# Patient Record
Sex: Female | Born: 1955 | Race: White | Hispanic: No | Marital: Married | Smoking: Never smoker
Health system: Southern US, Community
[De-identification: ages and names within clinical notes are randomized; demographics above are authoritative.]

## PROBLEM LIST (undated history)

## (undated) DIAGNOSIS — E785 Hyperlipidemia, unspecified: Secondary | ICD-10-CM

## (undated) DIAGNOSIS — Z9889 Other specified postprocedural states: Secondary | ICD-10-CM

## (undated) DIAGNOSIS — T7840XA Allergy, unspecified, initial encounter: Secondary | ICD-10-CM

## (undated) DIAGNOSIS — H269 Unspecified cataract: Secondary | ICD-10-CM

## (undated) DIAGNOSIS — T8859XA Other complications of anesthesia, initial encounter: Secondary | ICD-10-CM

## (undated) DIAGNOSIS — F419 Anxiety disorder, unspecified: Secondary | ICD-10-CM

## (undated) DIAGNOSIS — R112 Nausea with vomiting, unspecified: Secondary | ICD-10-CM

## (undated) DIAGNOSIS — F429 Obsessive-compulsive disorder, unspecified: Secondary | ICD-10-CM

## (undated) HISTORY — PX: OOPHORECTOMY: SHX86

## (undated) HISTORY — PX: COSMETIC SURGERY: SHX468

## (undated) HISTORY — DX: Anxiety disorder, unspecified: F41.9

## (undated) HISTORY — DX: Allergy, unspecified, initial encounter: T78.40XA

## (undated) HISTORY — DX: Obsessive-compulsive disorder, unspecified: F42.9

## (undated) HISTORY — PX: EYE SURGERY: SHX253

## (undated) HISTORY — DX: Unspecified cataract: H26.9

## (undated) HISTORY — DX: Hyperlipidemia, unspecified: E78.5

---

## 1990-03-03 HISTORY — PX: TUBAL LIGATION: SHX77

## 1995-03-04 HISTORY — PX: DOPPLER ECHOCARDIOGRAPHY: SHX263

## 1998-03-03 HISTORY — PX: ABDOMINAL HYSTERECTOMY: SHX81

## 1999-10-31 ENCOUNTER — Other Ambulatory Visit: Admission: RE | Admit: 1999-10-31 | Discharge: 1999-10-31 | Payer: Self-pay | Admitting: Obstetrics and Gynecology

## 2000-10-30 ENCOUNTER — Other Ambulatory Visit: Admission: RE | Admit: 2000-10-30 | Discharge: 2000-10-30 | Payer: Self-pay | Admitting: Obstetrics and Gynecology

## 2002-11-02 ENCOUNTER — Encounter: Payer: Self-pay | Admitting: Family Medicine

## 2004-02-16 ENCOUNTER — Ambulatory Visit: Payer: Self-pay | Admitting: Family Medicine

## 2004-03-03 HISTORY — PX: BREAST SURGERY: SHX581

## 2004-03-03 HISTORY — PX: BREAST BIOPSY: SHX20

## 2004-03-03 LAB — CONVERTED CEMR LAB: Pap Smear: NORMAL

## 2004-12-04 ENCOUNTER — Ambulatory Visit: Payer: Self-pay | Admitting: Family Medicine

## 2004-12-30 ENCOUNTER — Ambulatory Visit: Payer: Self-pay | Admitting: General Surgery

## 2006-01-26 ENCOUNTER — Ambulatory Visit: Payer: Self-pay | Admitting: General Surgery

## 2006-10-09 ENCOUNTER — Encounter: Payer: Self-pay | Admitting: Family Medicine

## 2006-10-09 DIAGNOSIS — F429 Obsessive-compulsive disorder, unspecified: Secondary | ICD-10-CM | POA: Insufficient documentation

## 2006-10-09 DIAGNOSIS — L259 Unspecified contact dermatitis, unspecified cause: Secondary | ICD-10-CM | POA: Insufficient documentation

## 2006-10-09 DIAGNOSIS — J309 Allergic rhinitis, unspecified: Secondary | ICD-10-CM | POA: Insufficient documentation

## 2006-11-06 ENCOUNTER — Ambulatory Visit: Payer: Self-pay | Admitting: Family Medicine

## 2006-12-02 HISTORY — PX: COLONOSCOPY: SHX174

## 2006-12-02 LAB — HM COLONOSCOPY: HM Colonoscopy: NORMAL

## 2006-12-07 ENCOUNTER — Encounter: Payer: Self-pay | Admitting: Family Medicine

## 2006-12-07 ENCOUNTER — Ambulatory Visit: Payer: Self-pay | Admitting: General Surgery

## 2007-02-02 ENCOUNTER — Ambulatory Visit: Payer: Self-pay | Admitting: General Surgery

## 2007-02-11 ENCOUNTER — Encounter: Payer: Self-pay | Admitting: Family Medicine

## 2007-09-02 ENCOUNTER — Encounter: Payer: Self-pay | Admitting: Family Medicine

## 2007-10-11 ENCOUNTER — Telehealth (INDEPENDENT_AMBULATORY_CARE_PROVIDER_SITE_OTHER): Payer: Self-pay | Admitting: *Deleted

## 2007-10-13 ENCOUNTER — Ambulatory Visit: Payer: Self-pay | Admitting: Family Medicine

## 2007-10-13 DIAGNOSIS — E7800 Pure hypercholesterolemia, unspecified: Secondary | ICD-10-CM | POA: Insufficient documentation

## 2007-10-14 LAB — CONVERTED CEMR LAB
Cholesterol: 181 mg/dL (ref 0–200)
HDL: 64.2 mg/dL (ref >39.0)
LDL Cholesterol: 107 mg/dL — ABNORMAL HIGH (ref 0–99)
Triglycerides: 48 mg/dL (ref 0–149)
VLDL: 10 mg/dL (ref 0–40)

## 2008-01-31 ENCOUNTER — Ambulatory Visit: Payer: Self-pay | Admitting: Family Medicine

## 2008-02-03 ENCOUNTER — Ambulatory Visit: Payer: Self-pay | Admitting: General Surgery

## 2008-02-22 ENCOUNTER — Encounter: Payer: Self-pay | Admitting: Family Medicine

## 2008-03-03 HISTORY — PX: LIPOSUCTION: SHX10

## 2008-09-11 ENCOUNTER — Encounter: Payer: Self-pay | Admitting: Family Medicine

## 2008-10-30 ENCOUNTER — Ambulatory Visit: Payer: Self-pay | Admitting: Family Medicine

## 2008-10-31 LAB — CONVERTED CEMR LAB
ALT: 23 units/L (ref 0–35)
AST: 25 units/L (ref 0–37)
Albumin: 4.1 g/dL (ref 3.5–5.2)
Alkaline Phosphatase: 60 units/L (ref 39–117)
Basophils Relative: 0.6 % (ref 0.0–3.0)
CO2: 31 meq/L (ref 19–32)
Calcium: 9.2 mg/dL (ref 8.4–10.5)
Chloride: 104 meq/L (ref 96–112)
Eosinophils Absolute: 0.3 10*3/uL (ref 0.0–0.7)
Hemoglobin: 14.3 g/dL (ref 12.0–15.0)
Lymphocytes Relative: 37.9 % (ref 12.0–46.0)
MCHC: 35.2 g/dL (ref 30.0–36.0)
Monocytes Relative: 6.6 % (ref 3.0–12.0)
Neutro Abs: 2.8 10*3/uL (ref 1.4–7.7)
RBC: 4.37 M/uL (ref 3.87–5.11)
Sodium: 139 meq/L (ref 135–145)
Total CHOL/HDL Ratio: 3
Total Protein: 6.8 g/dL (ref 6.0–8.3)
Triglycerides: 37 mg/dL (ref 0.0–149.0)

## 2009-01-01 ENCOUNTER — Telehealth: Payer: Self-pay | Admitting: Family Medicine

## 2009-02-06 ENCOUNTER — Ambulatory Visit: Payer: Self-pay | Admitting: Family Medicine

## 2009-02-06 ENCOUNTER — Encounter: Payer: Self-pay | Admitting: Family Medicine

## 2009-02-12 ENCOUNTER — Encounter (INDEPENDENT_AMBULATORY_CARE_PROVIDER_SITE_OTHER): Payer: Self-pay | Admitting: *Deleted

## 2010-01-09 ENCOUNTER — Telehealth: Payer: Self-pay | Admitting: Family Medicine

## 2010-03-05 ENCOUNTER — Ambulatory Visit: Payer: Self-pay | Admitting: Family Medicine

## 2010-03-05 ENCOUNTER — Encounter: Payer: Self-pay | Admitting: Family Medicine

## 2010-03-05 LAB — HM MAMMOGRAPHY: HM Mammogram: NORMAL

## 2010-03-06 ENCOUNTER — Ambulatory Visit: Payer: Self-pay | Admitting: Family Medicine

## 2010-03-06 ENCOUNTER — Encounter: Payer: Self-pay | Admitting: Family Medicine

## 2010-03-11 ENCOUNTER — Encounter: Payer: Self-pay | Admitting: Family Medicine

## 2010-04-02 NOTE — Progress Notes (Signed)
Summary: needs orders for mammo and dexa  Phone Note Call from Patient Call back at Home Phone [phone]   Caller: Patient Call For: Judith Part MD Summary of Call: Pt is asking for orders for bone density and mammogram to be done at [facility] clinic.  She wants this scheduled for the first week of january.  OK to leave message on answering machine. Initial call taken by: Lowella Petties CMA, AAMA,  January 09, 2010 9:12 AM  Follow-up for Phone Call        will do ref  have her check with her insurance to make sure dexa is paid for before she gets it , however  Follow-up by: Judith Part MD,  January 09, 2010 9:23 AM  Additional Follow-up for Phone Call Additional follow up Details #1::        MmG on 03/05/2010 at 3:00pm. Bone Density on 03/07/2010 at 10:00. Orders faxed and mailed to the patient. Additional Follow-up by: Carlton Adam,  January 10, 2010 4:06 PM  New Problems: SPECIAL SCREENING FOR OSTEOPOROSIS (ICD-V82.81) FAMILY HISTORY OSTEOPOROSIS (ICD-V17.81) OTHER SCREENING MAMMOGRAM (ICD-V76.12)   New Problems: SPECIAL SCREENING FOR OSTEOPOROSIS (ICD-V82.81) FAMILY HISTORY OSTEOPOROSIS (ICD-V17.81) OTHER SCREENING MAMMOGRAM (ICD-V76.12)

## 2010-04-04 NOTE — Letter (Signed)
Summary: Results Follow up Letter  [place] at [facility]  [address] [place], [place] [address]   Phone: [phone]  Fax: [phone]    03/11/2010 MRN: [id number]    [facility] [address] [place], [place]  [address]    Dear Ms. Reasons,  The following are the results of your recent test(s):  Test         Result    Pap Smear:        Normal _____  Not Normal _____ Comments: ______________________________________________________ Cholesterol: LDL(Bad cholesterol):         Your goal is less than:         HDL (Good cholesterol):       Your goal is more than: Comments:  ______________________________________________________ Mammogram:        Normal __X___  Not Normal _____ Comments:Repeat in one year.   ___________________________________________________________________ Hemoccult:        Normal _____  Not normal _______ Comments:    _____________________________________________________________________ Other Tests:    We routinely do not discuss normal results over the telephone.  If you desire a copy of the results, or you have any questions about this information we can discuss them at your next office visit.   Sincerely,    Idamae Schuller [place],MD  MT/ri

## 2010-09-23 ENCOUNTER — Encounter: Payer: Self-pay | Admitting: Family Medicine

## 2010-09-23 ENCOUNTER — Ambulatory Visit (INDEPENDENT_AMBULATORY_CARE_PROVIDER_SITE_OTHER): Payer: BC Managed Care – PPO | Admitting: Family Medicine

## 2010-09-23 VITALS — BP 120/62 | HR 71 | Temp 98.2°F | Wt 143.0 lb

## 2010-09-23 DIAGNOSIS — L989 Disorder of the skin and subcutaneous tissue, unspecified: Secondary | ICD-10-CM

## 2010-09-23 NOTE — Progress Notes (Signed)
  Subjective:    Patient ID: Donna Larson, female    DOB: 01-07-1956, 55 y.o.   MRN: [id number]  HPI 55 yo pt of Dr. Milinda Larson here for nodule on right 1st digit.  Was working in her garden one month ago, receive a cut that healed well. After it heeled, a small raised firm lesion remained. It's a little painful when you press down but no drainage, no erythema, no limited ROM. No fever or other systemic symptoms.  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  Patient Active Problem List  Diagnoses  . HYPERCHOLESTEROLEMIA  . OBSESSIVE-COMPULSIVE DISORDER  . ALLERGIC RHINITIS  . DERMATITIS, CONTACT, NOS   No past medical history on file. No past surgical history on file. History  Substance Use Topics  . Smoking status: Never Smoker   . Smokeless tobacco: Not on file  . Alcohol Use: Not on file   No family history on file. Allergies  Allergen Reactions  . Penicillins     REACTION: reaction not known  . Sulfonamide Derivatives     REACTION: reaction not known   No current outpatient prescriptions on file prior to visit.   The PMH, PSH, Social History, Family History, Medications, and allergies have been reviewed in [facility], and have been updated if relevant.   Review of Systems    See HPI Objective:   Physical Exam    BP 120/62  Pulse 71  Temp(Src) 98.2 F (36.8 C) (Oral)  Wt 143 lb (64.864 kg) Gen:  Alert, pleasant, NAD Right 1st digit- small raised, mildly erythematous, firm lesions on lateral aspect, non indurated, not warm. Normal ROM.    Assessment & Plan:   1. Skin lesion of hand    New.  Reassurance provided.  Unlikely infectious as symptoms have not progressed. Most likely scar tissue or other inflammatory cause, subcutaneous and not intra articular. Advised watchful waiting. Refer to surgery if she would like it removed. The patient indicates understanding of these issues and agrees with the plan. She will call us in a  week or two with an update.

## 2010-12-05 ENCOUNTER — Other Ambulatory Visit: Payer: Self-pay | Admitting: Family Medicine

## 2010-12-05 ENCOUNTER — Telehealth: Payer: Self-pay | Admitting: Family Medicine

## 2010-12-05 DIAGNOSIS — Z0000 Encounter for general adult medical examination without abnormal findings: Secondary | ICD-10-CM | POA: Insufficient documentation

## 2010-12-05 DIAGNOSIS — E7800 Pure hypercholesterolemia, unspecified: Secondary | ICD-10-CM

## 2010-12-05 NOTE — Telephone Encounter (Signed)
Message copied by Judy Pimple on Thu Dec 05, 2010  9:01 AM ------      Message from: Alvina Chou      Created: Wed Dec 04, 2010  2:14 PM      Regarding: Labs for fri 10-5       Patient is scheduled for CPX labs, please order future labs, Thanks , Camelia Eng                        Sorry I forgot to enter pts name on the last request.....duh

## 2010-12-06 ENCOUNTER — Other Ambulatory Visit (INDEPENDENT_AMBULATORY_CARE_PROVIDER_SITE_OTHER): Payer: BC Managed Care – PPO

## 2010-12-06 DIAGNOSIS — E7800 Pure hypercholesterolemia, unspecified: Secondary | ICD-10-CM

## 2010-12-06 LAB — LIPID PANEL
Cholesterol: 197 mg/dL (ref 0–200)
LDL Cholesterol: 98 mg/dL (ref 0–99)
Total CHOL/HDL Ratio: 2
VLDL: 14.2 mg/dL (ref 0.0–40.0)

## 2010-12-06 LAB — CBC WITH DIFFERENTIAL/PLATELET
Eosinophils Absolute: 0.4 10*3/uL (ref 0.0–0.7)
HCT: 41.9 % (ref 36.0–46.0)
Lymphs Abs: 3.1 10*3/uL (ref 0.7–4.0)
MCHC: 33.7 g/dL (ref 30.0–36.0)
MCV: 94.5 fl (ref 78.0–100.0)
Monocytes Absolute: 0.5 10*3/uL (ref 0.1–1.0)
Neutrophils Relative %: 44.5 % (ref 43.0–77.0)
Platelets: 255 10*3/uL (ref 150.0–400.0)

## 2010-12-06 LAB — COMPREHENSIVE METABOLIC PANEL
ALT: 23 U/L (ref 0–35)
AST: 29 U/L (ref 0–37)
Albumin: 4.1 g/dL (ref 3.5–5.2)
Alkaline Phosphatase: 76 U/L (ref 39–117)
Calcium: 9 mg/dL (ref 8.4–10.5)
Chloride: 107 mEq/L (ref 96–112)
Potassium: 4.2 mEq/L (ref 3.5–5.1)
Sodium: 139 mEq/L (ref 135–145)
Total Protein: 6.8 g/dL (ref 6.0–8.3)

## 2010-12-10 ENCOUNTER — Other Ambulatory Visit: Payer: Self-pay

## 2010-12-11 ENCOUNTER — Encounter: Payer: Self-pay | Admitting: Family Medicine

## 2010-12-16 ENCOUNTER — Ambulatory Visit (INDEPENDENT_AMBULATORY_CARE_PROVIDER_SITE_OTHER): Payer: BC Managed Care – PPO | Admitting: Family Medicine

## 2010-12-16 ENCOUNTER — Encounter: Payer: Self-pay | Admitting: Family Medicine

## 2010-12-16 VITALS — BP 100/64 | HR 72 | Temp 98.4°F | Ht 67.0 in | Wt 142.2 lb

## 2010-12-16 DIAGNOSIS — E7800 Pure hypercholesterolemia, unspecified: Secondary | ICD-10-CM

## 2010-12-16 DIAGNOSIS — Z0000 Encounter for general adult medical examination without abnormal findings: Secondary | ICD-10-CM

## 2010-12-16 DIAGNOSIS — Z23 Encounter for immunization: Secondary | ICD-10-CM

## 2010-12-16 NOTE — Patient Instructions (Signed)
Keep up the good work  Call us in January to refer you for your mammogram  Take your calcium and vitamin D  Keep up healthy diet and exercise

## 2010-12-16 NOTE — Assessment & Plan Note (Signed)
Reviewed health habits including diet and exercise and skin cancer prevention Also reviewed health mt list, fam hx and immunizations  Commended pt on good labs and good habits  Nl exam today She will call us in jan for her mam ref  Had dexa in past for fam hx- normal

## 2010-12-16 NOTE — Progress Notes (Signed)
Subjective:    Patient ID: Donna Larson, female    DOB: 01/10/1956, 55 y.o.   MRN: [id number]  HPI Here for wellness exam and to review chronic med problems   Good bp Good wt ith bmi of 22  Lipids great with high HDL and low LDL Lab Results  Component Value Date   CHOL 197 12/06/2010   CHOL 202* 10/30/2008   CHOL 181 10/13/2007   Lab Results  Component Value Date   HDL 84.40 12/06/2010   HDL 40.98 10/30/2008   HDL 11.9 10/13/2007   Lab Results  Component Value Date   LDLCALC 98 12/06/2010   LDLCALC 107* 10/13/2007   LDLCALC 89 11/06/2006   Lab Results  Component Value Date   TRIG 71.0 12/06/2010   TRIG 37.0 10/30/2008   TRIG 48 10/13/2007   Lab Results  Component Value Date   CHOLHDL 2 12/06/2010   CHOLHDL 3 10/30/2008   CHOLHDL 2.8 CALC 10/13/2007   Lab Results  Component Value Date   LDLDIRECT 109.0 10/30/2008   diet- does eat a very healthy diet -- getting rid of red meat  Eats game occasionally   Mam nl 1/12 Self exam- no lumps or changes   Had hyst partial in past - for fibroids  Last visit was dec/ jan -- all was good Last pap-no longer gets them - does get pelvic exam every year   colonosc 08 nl - rec f/u 10 y   Flu- just had one today  Td 04  Is getting good exercise too   Her OCD gets worse with stress- some elderly family with health problems  Overall is doing ok   Does not need to have any meds refilled   Patient Active Problem List  Diagnoses  . HYPERCHOLESTEROLEMIA  . OBSESSIVE-COMPULSIVE DISORDER  . ALLERGIC RHINITIS  . DERMATITIS, CONTACT, NOS  . Routine general medical examination at a health care facility   Past Medical History  Diagnosis Date  . Allergy     seasonal  . Hyperlipidemia   . OCD (obsessive compulsive disorder)    Past Surgical History  Procedure Date  . Abdominal hysterectomy 2000    Partial, fibroids  . Tubal ligation 1992  . Breast surgery   . Doppler echocardiography 1997    Equivocal   History  Substance  Use Topics  . Smoking status: Never Smoker   . Smokeless tobacco: Never Used  . Alcohol Use: Not on file   Family History  Problem Relation Age of Onset  . Arthritis Mother     osteoarthritis  . Depression Mother   . Hypertension Mother   . Osteoporosis Mother   . Hyperlipidemia Father   . Heart disease Father     Bicuspid aortic valve - replaced; sick sinus syndrome  . Heart disease Paternal Grandfather     MI  . Cancer Paternal Grandfather     Renal  . Heart disease Other     Heart valve repair  . Cancer Other     1 had Breast CA ; 2 had colon CA   Allergies  Allergen Reactions  . Penicillins     REACTION: reaction not known  . Sulfonamide Derivatives     REACTION: reaction not known   Current Outpatient Prescriptions on File Prior to Visit  Medication Sig Dispense Refill  . Calcium Carbonate-Vitamin D (CALCIUM-VITAMIN D) 600-200 MG-UNIT CAPS Take 1 capsule by mouth 3 (three) times daily.       Marland Kitchen FLUoxetine (  PROZAC) 20 MG tablet Take 20 mg by mouth daily.        . Nutritional Supplements (ESTROVEN PO) Take 1 tablet by mouth daily.               Review of Systems Review of Systems  Constitutional: Negative for fever, appetite change, fatigue and unexpected weight change.  Eyes: Negative for pain and visual disturbance.  Respiratory: Negative for cough and shortness of breath.   Cardiovascular: Negative for cp or palpitations    Gastrointestinal: Negative for nausea, diarrhea and constipation.  Genitourinary: Negative for urgency and frequency.  Skin: Negative for pallor or rash   Neurological: Negative for weakness, light-headedness, numbness and headaches.  Hematological: Negative for adenopathy. Does not bruise/bleed easily.  Psychiatric/Behavioral: Negative for dysphoric mood. The patient is not nervous/anxious.          Objective:   Physical Exam  Constitutional: She appears well-developed and well-nourished. No distress.  HENT:  Head: Normocephalic  and atraumatic.  Right Ear: External ear normal.  Left Ear: External ear normal.  Nose: Nose normal.  Mouth/Throat: Oropharynx is clear and moist.  Eyes: Conjunctivae and EOM are normal. Pupils are equal, round, and reactive to light. No scleral icterus.  Neck: Normal range of motion. Neck supple. No JVD present. Carotid bruit is not present. No thyromegaly present.  Cardiovascular: Normal rate, regular rhythm, normal heart sounds and intact distal pulses.  Exam reveals no gallop.   Pulmonary/Chest: Effort normal and breath sounds normal. No respiratory distress. She has no wheezes.  Abdominal: Soft. Bowel sounds are normal. She exhibits no distension and no mass. There is no tenderness.  Musculoskeletal: Normal range of motion. She exhibits no edema and no tenderness.  Lymphadenopathy:    She has no cervical adenopathy.  Neurological: She is alert. She has normal reflexes. No cranial nerve deficit. Coordination normal.  Skin: Skin is warm and dry. No rash noted. No erythema. No pallor.  Psychiatric: She has a normal mood and affect.          Assessment & Plan:

## 2010-12-16 NOTE — Assessment & Plan Note (Signed)
Very well controlled by diet  Urged to keep up the good work  Low sat fat diet reviewed

## 2011-02-17 ENCOUNTER — Telehealth: Payer: Self-pay | Admitting: *Deleted

## 2011-02-17 DIAGNOSIS — Z1231 Encounter for screening mammogram for malignant neoplasm of breast: Secondary | ICD-10-CM | POA: Insufficient documentation

## 2011-02-17 NOTE — Telephone Encounter (Signed)
Will do ref  

## 2011-02-17 NOTE — Telephone Encounter (Signed)
Pt request Referral for Mammogram to be done at [facility] for next month.

## 2011-03-10 ENCOUNTER — Telehealth: Payer: Self-pay

## 2011-03-10 NOTE — Telephone Encounter (Signed)
Pt called and left v/m that she already has mammogram scheduled 04/11/11 at 8:45 am. I confirmed with Heather at [facility]. I did not try to reach pt and I spoke with Shirlee Limerick and she has not tried to call pt and suggested I ck with Asher Muir.

## 2011-04-11 ENCOUNTER — Ambulatory Visit: Payer: Self-pay | Admitting: Family Medicine

## 2011-04-14 ENCOUNTER — Encounter: Payer: Self-pay | Admitting: Family Medicine

## 2011-04-21 ENCOUNTER — Encounter: Payer: Self-pay | Admitting: *Deleted

## 2012-03-10 ENCOUNTER — Telehealth: Payer: Self-pay

## 2012-03-10 NOTE — Telephone Encounter (Signed)
Pt left v/m requesting mammogram appt. Left v/m for pt to call back.

## 2012-03-10 NOTE — Telephone Encounter (Signed)
Left message with pt's husband asking pt to return call.

## 2012-03-11 NOTE — Telephone Encounter (Signed)
Pt said over one yr since saw Dr Milinda Antis; pt request our office schedule screening mammo at [place]. Pt Can go 02/24-28/14 prefers mornings but if needed can do afternoons and prefers 04/26/12 if possible. Call [phone] and may leave message.

## 2012-03-11 NOTE — Telephone Encounter (Signed)
Left v/m at [phone] for pt to call back.

## 2012-03-11 NOTE — Telephone Encounter (Signed)
MMG appt made with [facility] for 04/26/12 at 9:20 and patient is aware.

## 2012-04-26 ENCOUNTER — Ambulatory Visit: Payer: Self-pay | Admitting: Family Medicine

## 2012-04-27 ENCOUNTER — Encounter: Payer: Self-pay | Admitting: Family Medicine

## 2012-04-28 ENCOUNTER — Encounter: Payer: Self-pay | Admitting: *Deleted

## 2012-07-09 ENCOUNTER — Ambulatory Visit (INDEPENDENT_AMBULATORY_CARE_PROVIDER_SITE_OTHER): Payer: BC Managed Care – PPO | Admitting: Family Medicine

## 2012-07-09 ENCOUNTER — Telehealth: Payer: Self-pay | Admitting: Family Medicine

## 2012-07-09 ENCOUNTER — Encounter: Payer: Self-pay | Admitting: Family Medicine

## 2012-07-09 VITALS — BP 118/68 | HR 76 | Temp 98.3°F | Wt 137.5 lb

## 2012-07-09 DIAGNOSIS — J029 Acute pharyngitis, unspecified: Secondary | ICD-10-CM

## 2012-07-09 DIAGNOSIS — J020 Streptococcal pharyngitis: Secondary | ICD-10-CM | POA: Insufficient documentation

## 2012-07-09 LAB — POCT RAPID STREP A (OFFICE): Rapid Strep A Screen: NEGATIVE

## 2012-07-09 MED ORDER — AZITHROMYCIN 500 MG PO TABS: 500.0000 mg | ORAL_TABLET | Freq: Every day | ORAL | Status: DC

## 2012-07-09 NOTE — Progress Notes (Signed)
  Subjective:    Patient ID: Donna Larson, female    DOB: [birth year], 57 y.o.   MRN: [id number]  HPI CC: ST, fever  5d h/o sore throat, along with pus.  + headaches. Tmax 100.5.  Yesterday 99.8.  Today normal.   Mild cough. No abd pain, nausea, minimal sneezing and congestion.  No ear or tooth pain.  Recently stopped allergy meds.  No sick contact or smokers at home. No h/o asthma.  Pt is a doctor.  Past Medical History  Diagnosis Date  . Allergy     seasonal  . Hyperlipidemia   . OCD (obsessive compulsive disorder)      Review of Systems Per HPI    Objective:   Physical Exam  Nursing note and vitals reviewed. Constitutional: She appears well-developed and well-nourished. No distress.  HENT:  Head: Normocephalic and atraumatic.  Right Ear: Hearing, tympanic membrane, external ear and ear canal normal.  Left Ear: Hearing, tympanic membrane, external ear and ear canal normal.  Nose: No mucosal edema or rhinorrhea. Right sinus exhibits no maxillary sinus tenderness and no frontal sinus tenderness. Left sinus exhibits no maxillary sinus tenderness and no frontal sinus tenderness.  Mouth/Throat: Uvula is midline and mucous membranes are normal. Oropharyngeal exudate, posterior oropharyngeal edema and posterior oropharyngeal erythema present. No tonsillar abscesses.  Erythematous oropharynx, enlarged tonsils, and exudates bilateral tonsillar crypts  Eyes: Conjunctivae and EOM are normal. Pupils are equal, round, and reactive to light. No scleral icterus.  Neck: Normal range of motion. Neck supple.  Cardiovascular: Normal rate, regular rhythm, normal heart sounds and intact distal pulses.   No murmur heard. Pulmonary/Chest: Effort normal and breath sounds normal. No respiratory distress. She has no wheezes. She has no rales.  Lymphadenopathy:    She has cervical adenopathy (bilateral AC LAD, tender).  Skin: Skin is warm and dry. No rash noted.       Assessment & Plan:

## 2012-07-09 NOTE — Assessment & Plan Note (Signed)
2/4 centor criteria, however markedly abnormal oropharyngeal exam increases suspicion for strep throat. Treat as such with azithromycin (avoids PCN 2/2 fmhx reaction). See pt instructions for supportive care. Discussed red flags to seek urgent care or return to see [place]. Pt agrees with plan.

## 2012-07-09 NOTE — Patient Instructions (Signed)
This does look like strep despite negative test. Treat with azithromycin. Push fluids and plenty of rest. May use ibuprofen for throat inflammation. Salt water gargles. Suck on cold things like popsicles or warm things like herbal teas (whichever soothes the throat better). Return if fever >101.5, worsening pain, or trouble opening/closing mouth, or hoarse voice. Good to see you today, call clinic with questions.

## 2012-07-09 NOTE — Telephone Encounter (Signed)
Patient being seen now

## 2012-07-09 NOTE — Telephone Encounter (Signed)
Patient Information:  Caller Name: Jayli  Phone: [phone]  Patient: Donna Larson, Donna Larson  Gender: Female  DOB: 03-16-1955  Age: 57 Years  PCP: Roxy Manns [facility])  Office Follow Up:  Does the office need to follow up with this patient?: No  Instructions For The Office: N/A  RN Note:  care advice reviewed along with call back paramters. Appt scheduled.  Symptoms  Reason For Call & Symptoms: Patient states she has had a sore throat since Monday. Temperature for two days. No fever today. Pus on tonsils. Pain with swallowing, tired and rundown feeling.  She is eating and drinking . Voice is clear  Reviewed Health History In EMR: Yes  Reviewed Medications In EMR: Yes  Reviewed Allergies In EMR: Yes  Reviewed Surgeries / Procedures: Yes  Date of Onset of Symptoms: 07/05/2012  Treatments Tried: Fluids, water, Tylenol ,Gargling warm salt water.  Treatments Tried Worked: No  Guideline(s) Used:  Sore Throat  Disposition Per Guideline:   See Today in Office  Reason For Disposition Reached:   Pus on tonsils (back of throat) and swollen neck lymph nodes ("glands")  Advice Given:  For Relief of Sore Throat Pain:  Sip warm chicken broth or apple juice.  Suck on hard candy or a throat lozenge (over-the-counter).  Gargle warm salt water 3 times daily (1 teaspoon of salt in 8 oz or 240 ml of warm water).  Avoid cigarette smoke.  Pain Medicines:  For pain relief, you can take either acetaminophen, ibuprofen, or naproxen.  They are over-the-counter (OTC) pain drugs. You can buy them at the drugstore.  Acetaminophen (e.g., Tylenol):  Regular Strength Tylenol: Take 650 mg (two 325 mg pills) by mouth every 4-6 hours as needed. Each Regular Strength Tylenol pill has 325 mg of acetaminophen.  Extra Strength Tylenol: Take 1,000 mg (two 500 mg pills) every 8 hours as needed. Each Extra Strength Tylenol pill has 500 mg of acetaminophen.  Ibuprofen (e.g., Motrin, Advil):  Take 400 mg  (two 200 mg pills) by mouth every 6 hours.  Soft Diet:   Cold drinks and milk shakes are especially good (Reason: swollen tonsils can make some foods hard to swallow).  Liquids:  Adequate liquid intake is important to prevent dehydration. Drink 6-8 glasses of water per day.  Call Back If:  Sore throat is mild but lasts longer than 4 days  Fever lasts longer than 3 days  You become worse.  Patient Will Follow Care Advice:  YES  Appointment Scheduled:  07/09/2012 15:30:00 Appointment Scheduled Provider:  Eustaquio Boyden [facility])

## 2013-01-07 ENCOUNTER — Telehealth: Payer: Self-pay | Admitting: Family Medicine

## 2013-01-07 NOTE — Telephone Encounter (Signed)
Pt requesting refill for Vagifem.  Last physical was in 2012.  Please advise.

## 2013-01-07 NOTE — Telephone Encounter (Signed)
?   Last refill -she must have been getting it from another doctor  Please schedule PE and refill until then

## 2013-01-11 NOTE — Telephone Encounter (Signed)
Left message with family requesting pt to call office  

## 2013-01-12 NOTE — Telephone Encounter (Signed)
Pt already has CPE scheduled but pt said [facility] sent Rx to [place] by mistake then they faxed the request to the correct doctor, pt doesn't need Rx refilled because her GYN had already refilled it for her

## 2013-01-23 ENCOUNTER — Telehealth: Payer: Self-pay | Admitting: Family Medicine

## 2013-01-23 DIAGNOSIS — Z0000 Encounter for general adult medical examination without abnormal findings: Secondary | ICD-10-CM

## 2013-01-23 NOTE — Telephone Encounter (Signed)
Message copied by Judy Pimple on Sun Jan 23, 2013  4:16 PM ------      Message from: Alvina Chou      Created: Tue Jan 18, 2013 12:14 PM      Regarding: Lab orders for Monday, 11.24.14       Patient is scheduled for CPX labs, please order future labs, Thanks , Terri       ------

## 2013-01-24 ENCOUNTER — Other Ambulatory Visit (INDEPENDENT_AMBULATORY_CARE_PROVIDER_SITE_OTHER): Payer: BC Managed Care – PPO

## 2013-01-24 DIAGNOSIS — Z0000 Encounter for general adult medical examination without abnormal findings: Secondary | ICD-10-CM

## 2013-01-24 LAB — CBC WITH DIFFERENTIAL/PLATELET
Basophils Absolute: 0 10*3/uL (ref 0.0–0.1)
Basophils Relative: 0.6 % (ref 0.0–3.0)
Eosinophils Absolute: 0.4 10*3/uL (ref 0.0–0.7)
Eosinophils Relative: 5.6 % — ABNORMAL HIGH (ref 0.0–5.0)
HCT: 38 % (ref 36.0–46.0)
Lymphocytes Relative: 37.9 % (ref 12.0–46.0)
Lymphs Abs: 2.9 10*3/uL (ref 0.7–4.0)
Monocytes Absolute: 0.5 10*3/uL (ref 0.1–1.0)
Neutrophils Relative %: 48.6 % (ref 43.0–77.0)
Platelets: 301 10*3/uL (ref 150.0–400.0)
RBC: 4.13 Mil/uL (ref 3.87–5.11)
RDW: 12.6 % (ref 11.5–14.6)
WBC: 7.5 10*3/uL (ref 4.5–10.5)

## 2013-01-24 LAB — LIPID PANEL
HDL: 81.7 mg/dL (ref >39.00)
LDL Cholesterol: 93 mg/dL (ref 0–99)
Total CHOL/HDL Ratio: 2
Triglycerides: 43 mg/dL (ref 0.0–149.0)

## 2013-01-24 LAB — TSH: TSH: 2.06 u[IU]/mL (ref 0.35–5.50)

## 2013-01-24 LAB — COMPREHENSIVE METABOLIC PANEL
ALT: 16 U/L (ref 0–35)
AST: 23 U/L (ref 0–37)
Albumin: 3.7 g/dL (ref 3.5–5.2)
BUN: 15 mg/dL (ref 6–23)
Calcium: 9.2 mg/dL (ref 8.4–10.5)
Chloride: 103 mEq/L (ref 96–112)
Glucose, Bld: 108 mg/dL — ABNORMAL HIGH (ref 70–99)
Potassium: 4.3 mEq/L (ref 3.5–5.1)
Sodium: 138 mEq/L (ref 135–145)
Total Bilirubin: 0.5 mg/dL (ref 0.3–1.2)
Total Protein: 6.9 g/dL (ref 6.0–8.3)

## 2013-01-31 ENCOUNTER — Ambulatory Visit (INDEPENDENT_AMBULATORY_CARE_PROVIDER_SITE_OTHER): Payer: BC Managed Care – PPO | Admitting: Family Medicine

## 2013-01-31 ENCOUNTER — Encounter: Payer: Self-pay | Admitting: Family Medicine

## 2013-01-31 VITALS — BP 104/66 | HR 66 | Temp 98.4°F | Ht 66.75 in | Wt 136.8 lb

## 2013-01-31 DIAGNOSIS — Z0000 Encounter for general adult medical examination without abnormal findings: Secondary | ICD-10-CM

## 2013-01-31 NOTE — Progress Notes (Signed)
Pre-visit discussion using our clinic review tool. No additional management support is needed unless otherwise documented below in the visit note.  

## 2013-01-31 NOTE — Assessment & Plan Note (Signed)
Reviewed health habits including diet and exercise and skin cancer prevention Reviewed appropriate screening tests for age  Also reviewed health mt list, fam hx and immunization status , as well as social and family history   See HPI Wellness labs reviewed  

## 2013-01-31 NOTE — Patient Instructions (Addendum)
I'm glad you are doing so well  Take good care of yourself  Cholesterol looks good  Don't forget to schedule your own mammogram

## 2013-01-31 NOTE — Progress Notes (Signed)
Subjective:    Patient ID: Donna Larson, female    DOB: 1955/09/18, 57 y.o.   MRN: [id number]  HPI Here for health maintenance exam and to review chronic medical problems   Doing pretty well overall   A very busy time lately Also getting over a cold - cough is hanging and able to cough up some mucous  No fever since the beginning    Wt is down 1 lb with bmi of 21   Had a Tdap a year ago at work   Flu vaccine 9/14   Gyn exam 12/11-- she has appt 12/29 -- later this month  Does exam w/o a pap  Still using the vaginal estradiol  Partial hyst in past for fibroids   Mammogram 2/14 normal  Self exam - no lumps or changes   colonosc 10/08 normal with 10 year recall   Mood - has been pretty good overall / even in the face of situational stress  On prozac - still working for her  Lots to do at work- gets overloaded     Chemistry      Component Value Date/Time   NA 138 01/24/2013 0827   K 4.3 01/24/2013 0827   CL 103 01/24/2013 0827   CO2 29 01/24/2013 0827   BUN 15 01/24/2013 0827   CREATININE 0.8 01/24/2013 0827      Component Value Date/Time   CALCIUM 9.2 01/24/2013 0827   ALKPHOS 64 01/24/2013 0827   AST 23 01/24/2013 0827   ALT 16 01/24/2013 0827   BILITOT 0.5 01/24/2013 0827     glucose 108 - this tends to fluctuate/ she is not overwt  Lab Results  Component Value Date   WBC 7.5 01/24/2013   HGB 12.8 01/24/2013   HCT 38.0 01/24/2013   MCV 92.0 01/24/2013   PLT 301.0 01/24/2013    Lab Results  Component Value Date   TSH 2.06 01/24/2013   Hyperlipidemia  Lab Results  Component Value Date   CHOL 183 01/24/2013   CHOL 197 12/06/2010   CHOL 202* 10/30/2008   Lab Results  Component Value Date   HDL 81.70 01/24/2013   HDL 84.40 12/06/2010   HDL 13.24 10/30/2008   Lab Results  Component Value Date   LDLCALC 93 01/24/2013   LDLCALC 98 12/06/2010   LDLCALC 107* 10/13/2007   Lab Results  Component Value Date   TRIG 43.0 01/24/2013   TRIG 71.0  12/06/2010   TRIG 37.0 10/30/2008   Lab Results  Component Value Date   CHOLHDL 2 01/24/2013   CHOLHDL 2 12/06/2010   CHOLHDL 3 10/30/2008  very good cholesterol level overall  Getting exercise- she walks almost every day    Lab Results  Component Value Date   LDLDIRECT 109.0 10/30/2008     Review of Systems Review of Systems  Constitutional: Negative for fever, appetite change, fatigue and unexpected weight change.  Eyes: Negative for pain and visual disturbance.  ENT pos for rhinorrhea and congestion without sinus pain  Respiratory: Negative for cough and shortness of breath.   Cardiovascular: Negative for cp or palpitations    Gastrointestinal: Negative for nausea, diarrhea and constipation.  Genitourinary: Negative for urgency and frequency.  Skin: Negative for pallor or rash   Neurological: Negative for weakness, light-headedness, numbness and headaches.  Hematological: Negative for adenopathy. Does not bruise/bleed easily.  Psychiatric/Behavioral: Negative for dysphoric mood. The patient is not nervous/anxious.         Objective:  Physical Exam  Constitutional: She appears well-developed and well-nourished. No distress.  Slim and well appearing   HENT:  Head: Normocephalic and atraumatic.  Right Ear: External ear normal.  Left Ear: External ear normal.  Mouth/Throat: Oropharynx is clear and moist.  Nares are injected and congested  Mild clear rhinorrhea  Eyes: Conjunctivae and EOM are normal. Pupils are equal, round, and reactive to light. Right eye exhibits no discharge. Left eye exhibits no discharge. No scleral icterus.  Neck: Normal range of motion. Neck supple. No JVD present. No thyromegaly present.  Cardiovascular: Normal rate, regular rhythm, normal heart sounds and intact distal pulses.  Exam reveals no gallop.   Pulmonary/Chest: Effort normal and breath sounds normal. No respiratory distress. She has no wheezes. She has no rales.  Abdominal: Soft. Bowel  sounds are normal. She exhibits no distension and no mass. There is no tenderness.  Musculoskeletal: She exhibits no edema and no tenderness.  Lymphadenopathy:    She has no cervical adenopathy.  Neurological: She is alert. She has normal reflexes. No cranial nerve deficit. She exhibits normal muscle tone. Coordination normal.  Skin: Skin is warm and dry. No rash noted. No erythema. No pallor.  Psychiatric: She has a normal mood and affect.          Assessment & Plan:

## 2013-03-03 HISTORY — PX: BREAST BIOPSY: SHX20

## 2013-04-27 ENCOUNTER — Encounter: Payer: Self-pay | Admitting: Family Medicine

## 2013-04-27 ENCOUNTER — Ambulatory Visit: Payer: Self-pay | Admitting: Family Medicine

## 2013-05-10 ENCOUNTER — Ambulatory Visit: Payer: Self-pay | Admitting: Family Medicine

## 2013-05-11 ENCOUNTER — Encounter: Payer: Self-pay | Admitting: General Surgery

## 2013-05-11 ENCOUNTER — Encounter: Payer: Self-pay | Admitting: Family Medicine

## 2013-05-11 ENCOUNTER — Ambulatory Visit (INDEPENDENT_AMBULATORY_CARE_PROVIDER_SITE_OTHER): Payer: BC Managed Care – PPO | Admitting: General Surgery

## 2013-05-11 VITALS — BP 116/62 | HR 72 | Resp 12 | Ht 66.0 in | Wt 140.0 lb

## 2013-05-11 DIAGNOSIS — R920 Mammographic microcalcification found on diagnostic imaging of breast: Secondary | ICD-10-CM

## 2013-05-11 NOTE — Progress Notes (Signed)
Patient ID: Donna HighlandSarah J Larson, female   DOB: 21-Jan-1956, 58 y.o.   MRN: [id number]  Chief Complaint  Patient presents with  . Breast Problem    HPI Donna Larson is a 58 y.o. female.  who presents for a breast evaluation. The most recent mammogram was done on 05-10-13.  Patient does perform regular self breast checks and gets regular mammograms done.    HPI  Past Medical History  Diagnosis Date  . Allergy     seasonal  . Hyperlipidemia   . OCD (obsessive compulsive disorder)     Past Surgical History  Procedure Laterality Date  . Abdominal hysterectomy  2000    Partial, fibroids  . Tubal ligation  1992  . Doppler echocardiography  1997    Equivocal  . Breast surgery Left 2006  . Liposuction  2010    Family History  Problem Relation Age of Onset  . Arthritis Mother     osteoarthritis  . Depression Mother   . Hypertension Mother   . Osteoporosis Mother   . Hyperlipidemia Father   . Heart disease Father     Bicuspid aortic valve - replaced; sick sinus syndrome  . Heart disease Paternal Grandfather     MI  . Cancer Paternal Grandfather     Renal  . Heart disease Other     Heart valve repair  . Cancer Other     1 had Breast CA ; 2 had colon CA    Social History History  Substance Use Topics  . Smoking status: Never Smoker   . Smokeless tobacco: Never Used  . Alcohol Use: Yes     Comment: daily-wine/beer     Allergies  Allergen Reactions  . Penicillins     REACTION: reaction not known  . Shellfish Allergy Nausea And Vomiting  . Sulfonamide Derivatives     REACTION: reaction not known    Current Outpatient Prescriptions  Medication Sig Dispense Refill  . Calcium Carbonate-Vitamin D (CALCIUM-VITAMIN D) 600-200 MG-UNIT CAPS Take 1 capsule by mouth 3 (three) times daily.       . Cholecalciferol (VITAMIN D) 400 UNITS capsule Take 1,200 Units by mouth daily.      Marland Kitchen. FLUoxetine (PROZAC) 20 MG tablet Take 20 mg by mouth daily.        Marland Kitchen. ibuprofen (ADVIL,MOTRIN)  600 MG tablet Take 600 mg by mouth every 6 (six) hours as needed.      . Multiple Vitamins-Minerals (PRESERVISION AREDS PO) Take 1 tablet by mouth daily.      . Nutritional Supplements (ESTROVEN PO) Take 1 tablet by mouth daily.         No current facility-administered medications for this visit.    Review of Systems Review of Systems  Constitutional: Negative.   Respiratory: Negative.   Cardiovascular: Negative.     Blood pressure 116/62, pulse 72, resp. rate 12, height 5\' 6"  (1.676 m), weight 140 lb (63.504 kg).  Physical Exam Physical Exam  Constitutional: She is oriented to person, place, and time. She appears well-developed and well-nourished.  Neck: Neck supple.  Cardiovascular: Normal rate, regular rhythm and normal heart sounds.   Pulmonary/Chest: Effort normal and breath sounds normal. Right breast exhibits no inverted nipple, no mass, no nipple discharge, no skin change and no tenderness. Left breast exhibits no inverted nipple, no mass, no nipple discharge, no skin change and no tenderness.  Lymphadenopathy:    She has no cervical adenopathy.    She has no axillary adenopathy.  Neurological: She is alert and oriented to person, place, and time.  Skin: Skin is warm and dry.    Data Reviewed Motor on mammograms dated April 27, 2013 identified calcifications of left breast.  Focal spot compression views of left breast dated May 10, 2013 showed a 1.2 x 2.4 x 3.8 cm area of calcifications in the upper outer quadrant described as linear with some branching. BI-RAD-4.  Assessment    Microcalcifications of the left breast. Solid we'll change over time with better visualization as the adjacent breast parenchyma received.     Plan    Options for management were reviewed: 1) careful 6 month followup exam versus 2) earlier stereotactic biopsy. The patient is not want to watch and wait, and for that reason arrangements are in place for a biopsy next week.     Patient has  been scheduled for a left breast stereotactic biopsy at [facility] for 05-16-13 at 3 pm. She will check-in at the [facility] at 2:30 pm. This patient is aware of date, time, and instructions. Patient verbalizes understanding.    Donna Larson 05/14/2013, 8:53 PM  Roxy Manns, MD

## 2013-05-11 NOTE — Patient Instructions (Addendum)
Continue self breast exams. Call office for any new breast issues or concerns.  Patient has been scheduled for a left breast stereotactic biopsy at [facility] for 05-16-13 at 3 pm. [facility] at 2:30 pm. This patient is aware of date, time, and instructions. Patient verbalizes understanding.

## 2013-05-14 DIAGNOSIS — R920 Mammographic microcalcification found on diagnostic imaging of breast: Secondary | ICD-10-CM | POA: Insufficient documentation

## 2013-05-16 ENCOUNTER — Ambulatory Visit: Payer: Self-pay | Admitting: General Surgery

## 2013-05-16 DIAGNOSIS — R920 Mammographic microcalcification found on diagnostic imaging of breast: Secondary | ICD-10-CM

## 2013-05-18 ENCOUNTER — Telehealth: Payer: Self-pay | Admitting: General Surgery

## 2013-05-18 LAB — PATHOLOGY REPORT

## 2013-05-18 NOTE — Telephone Encounter (Signed)
Notified path stromal sclerosis. No pain.  F/U as scheduled.

## 2013-05-19 ENCOUNTER — Encounter: Payer: Self-pay | Admitting: General Surgery

## 2013-05-22 ENCOUNTER — Encounter: Payer: Self-pay | Admitting: General Surgery

## 2013-05-22 NOTE — Progress Notes (Signed)
Patient ID: Donna HighlandSarah J Propes, female   DOB: 1956-02-15, 58 y.o.   MRN: [id number]   A copy of the 05/16/2013 breast biopsy report was faxed to the patient per her request. [phone]

## 2013-05-23 ENCOUNTER — Ambulatory Visit (INDEPENDENT_AMBULATORY_CARE_PROVIDER_SITE_OTHER): Payer: BC Managed Care – PPO | Admitting: *Deleted

## 2013-05-23 DIAGNOSIS — R920 Mammographic microcalcification found on diagnostic imaging of breast: Secondary | ICD-10-CM

## 2013-05-23 NOTE — Progress Notes (Signed)
Patient here today for follow up post left breast biopsy. steristrip in place and aware it may come off in one week.  Minimal bruising noted.  The patient is aware that a heating pad may be used for comfort as needed.  Aware of pathology. Follow up as scheduled.

## 2013-05-24 ENCOUNTER — Encounter: Payer: Self-pay | Admitting: Family Medicine

## 2013-12-07 ENCOUNTER — Ambulatory Visit (INDEPENDENT_AMBULATORY_CARE_PROVIDER_SITE_OTHER): Payer: BC Managed Care – PPO

## 2013-12-07 DIAGNOSIS — Z23 Encounter for immunization: Secondary | ICD-10-CM

## 2014-01-02 ENCOUNTER — Encounter: Payer: Self-pay | Admitting: General Surgery

## 2014-05-25 ENCOUNTER — Encounter: Payer: Self-pay | Admitting: General Surgery

## 2014-06-19 ENCOUNTER — Encounter: Payer: Self-pay | Admitting: Family Medicine

## 2014-06-19 ENCOUNTER — Telehealth: Payer: Self-pay | Admitting: Family Medicine

## 2014-06-19 DIAGNOSIS — Z0000 Encounter for general adult medical examination without abnormal findings: Secondary | ICD-10-CM

## 2014-06-19 NOTE — Telephone Encounter (Signed)
-----   Message from Terri J Walsh sent at 06/15/2014  3:00 PM EDT ----- Regarding: Lab orders for Tuesday, 4.19.16 Patient is scheduled for CPX labs, please order future labs, Thanks , Terri  

## 2014-06-20 ENCOUNTER — Other Ambulatory Visit (INDEPENDENT_AMBULATORY_CARE_PROVIDER_SITE_OTHER): Payer: BLUE CROSS/BLUE SHIELD

## 2014-06-20 DIAGNOSIS — Z0000 Encounter for general adult medical examination without abnormal findings: Secondary | ICD-10-CM | POA: Diagnosis not present

## 2014-06-20 LAB — CBC WITH DIFFERENTIAL/PLATELET
BASOS PCT: 0.4 % (ref 0.0–3.0)
Basophils Absolute: 0 10*3/uL (ref 0.0–0.1)
Eosinophils Absolute: 0.2 10*3/uL (ref 0.0–0.7)
Eosinophils Relative: 3.7 % (ref 0.0–5.0)
HEMATOCRIT: 41.8 % (ref 36.0–46.0)
HEMOGLOBIN: 14.2 g/dL (ref 12.0–15.0)
LYMPHS PCT: 37.6 % (ref 12.0–46.0)
Lymphs Abs: 2.3 10*3/uL (ref 0.7–4.0)
MCHC: 34 g/dL (ref 30.0–36.0)
MCV: 92.6 fl (ref 78.0–100.0)
MONO ABS: 0.5 10*3/uL (ref 0.1–1.0)
Monocytes Relative: 7.5 % (ref 3.0–12.0)
NEUTROS ABS: 3.1 10*3/uL (ref 1.4–7.7)
Neutrophils Relative %: 50.8 % (ref 43.0–77.0)
Platelets: 295 10*3/uL (ref 150.0–400.0)
RBC: 4.51 Mil/uL (ref 3.87–5.11)
RDW: 12.8 % (ref 11.5–15.5)
WBC: 6.1 10*3/uL (ref 4.0–10.5)

## 2014-06-20 LAB — COMPREHENSIVE METABOLIC PANEL
ALBUMIN: 4.2 g/dL (ref 3.5–5.2)
ALT: 18 U/L (ref 0–35)
AST: 19 U/L (ref 0–37)
Alkaline Phosphatase: 61 U/L (ref 39–117)
BUN: 17 mg/dL (ref 6–23)
CHLORIDE: 104 meq/L (ref 96–112)
CO2: 31 mEq/L (ref 19–32)
Calcium: 9.6 mg/dL (ref 8.4–10.5)
Creatinine, Ser: 0.83 mg/dL (ref 0.40–1.20)
GFR: 74.81 mL/min (ref >60.00)
Glucose, Bld: 105 mg/dL — ABNORMAL HIGH (ref 70–99)
Potassium: 5.2 mEq/L — ABNORMAL HIGH (ref 3.5–5.1)
Sodium: 137 mEq/L (ref 135–145)
Total Bilirubin: 0.5 mg/dL (ref 0.2–1.2)
Total Protein: 6.9 g/dL (ref 6.0–8.3)

## 2014-06-20 LAB — TSH: TSH: 3.16 u[IU]/mL (ref 0.35–4.50)

## 2014-06-20 LAB — LIPID PANEL
Cholesterol: 209 mg/dL — ABNORMAL HIGH (ref 0–200)
HDL: 84.9 mg/dL (ref >39.00)
LDL Cholesterol: 115 mg/dL — ABNORMAL HIGH (ref 0–99)
NonHDL: 124.1
Total CHOL/HDL Ratio: 2
Triglycerides: 45 mg/dL (ref 0.0–149.0)
VLDL: 9 mg/dL (ref 0.0–40.0)

## 2014-06-28 ENCOUNTER — Ambulatory Visit (INDEPENDENT_AMBULATORY_CARE_PROVIDER_SITE_OTHER): Payer: BLUE CROSS/BLUE SHIELD | Admitting: Family Medicine

## 2014-06-28 ENCOUNTER — Telehealth: Payer: Self-pay | Admitting: Family Medicine

## 2014-06-28 ENCOUNTER — Encounter: Payer: Self-pay | Admitting: Family Medicine

## 2014-06-28 VITALS — BP 108/72 | HR 66 | Temp 98.6°F | Ht 67.0 in | Wt 141.5 lb

## 2014-06-28 DIAGNOSIS — Z0000 Encounter for general adult medical examination without abnormal findings: Secondary | ICD-10-CM | POA: Diagnosis not present

## 2014-06-28 DIAGNOSIS — R920 Mammographic microcalcification found on diagnostic imaging of breast: Secondary | ICD-10-CM

## 2014-06-28 DIAGNOSIS — Z1231 Encounter for screening mammogram for malignant neoplasm of breast: Secondary | ICD-10-CM

## 2014-06-28 NOTE — Patient Instructions (Signed)
Stop at check out for referral for mammogram  Take care of yourself  Good job with better diet and exercise

## 2014-06-28 NOTE — Telephone Encounter (Signed)
i called [facility] to set Donna Larson up for her screening mammogram.  [facility] the last time Donna Donna Larson was there she had catagory 4 mammogram. March 2015.  Pt stated she had biopsy.  jamie needs diagnotic order not screening for the mammogram

## 2014-06-28 NOTE — Progress Notes (Signed)
Pre visit review using our clinic review tool, if applicable. No additional management support is needed unless otherwise documented below in the visit note. 

## 2014-06-28 NOTE — Progress Notes (Signed)
Subjective:    Patient ID: Donna Larson, female    DOB: [birth year], 59 y.o.   MRN: [id number]  HPI Here for health maintenance exam and to review chronic medical problems    Has been pretty stressed out this year  Kept getting upper resp infx in the fall and winter  Gained some wt  Stress - work and also caring for mother    Wt is up 1 lb with bmi of 22 Has been eating more hamburgers and bratwurst and overdid it at [employer] and [employer]  Now trying to choose better things  Less time for self care  Better with exercise -walking more /making the effort and has worked to get weight    C/ HIV screening - donates blood so that has been done   Has had a hysterectomy  Mammogram 3/15- showed microcalcifications -and bx was neg for malignancy  Due for her yearly mammogram - goes to [facility] Self exam - no lumps or changes   Colonoscopy 10/08- 10 year recall  No fam hx of colon cancer   Td 12/13   Results for orders placed or performed in visit on 06/20/14  Comprehensive metabolic panel  Result Value Ref Range   Sodium 137 135 - 145 mEq/L   Potassium 5.2 (H) 3.5 - 5.1 mEq/L   Chloride 104 96 - 112 mEq/L   CO2 31 19 - 32 mEq/L   Glucose, Bld 105 (H) 70 - 99 mg/dL   BUN 17 6 - 23 mg/dL   Creatinine, Ser 4.09 0.40 - 1.20 mg/dL   Total Bilirubin 0.5 0.2 - 1.2 mg/dL   Alkaline Phosphatase 61 39 - 117 U/L   AST 19 0 - 37 U/L   ALT 18 0 - 35 U/L   Total Protein 6.9 6.0 - 8.3 g/dL   Albumin 4.2 3.5 - 5.2 g/dL   Calcium 9.6 8.4 - 81.1 mg/dL   GFR 91.47 >82.95 mL/min  Lipid panel  Result Value Ref Range   Cholesterol 209 (H) 0 - 200 mg/dL   Triglycerides 62.1 0.0 - 149.0 mg/dL   HDL 30.86 >57.84 mg/dL   VLDL 9.0 0.0 - 69.6 mg/dL   LDL Cholesterol 295 (H) 0 - 99 mg/dL   Total CHOL/HDL Ratio 2    NonHDL 124.10   TSH  Result Value Ref Range   TSH 3.16 0.35 - 4.50 uIU/mL  CBC with Differential/Platelet  Result Value Ref Range   WBC 6.1 4.0 - 10.5 K/uL   RBC 4.51 3.87 - 5.11  Mil/uL   Hemoglobin 14.2 12.0 - 15.0 g/dL   HCT 28.4 13.2 - 44.0 %   MCV 92.6 78.0 - 100.0 fl   MCHC 34.0 30.0 - 36.0 g/dL   RDW 10.2 72.5 - 36.6 %   Platelets 295.0 150.0 - 400.0 K/uL   Neutrophils Relative % 50.8 43.0 - 77.0 %   Lymphocytes Relative 37.6 12.0 - 46.0 %   Monocytes Relative 7.5 3.0 - 12.0 %   Eosinophils Relative 3.7 0.0 - 5.0 %   Basophils Relative 0.4 0.0 - 3.0 %   Neutro Abs 3.1 1.4 - 7.7 K/uL   Lymphs Abs 2.3 0.7 - 4.0 K/uL   Monocytes Absolute 0.5 0.1 - 1.0 K/uL   Eosinophils Absolute 0.2 0.0 - 0.7 K/uL   Basophils Absolute 0.0 0.0 - 0.1 K/uL   Stable /good labs    Patient Active Problem List   Diagnosis Date Noted  . Breast microcalcification, mammographic 05/14/2013  .  Other screening mammogram 02/17/2011  . Routine general medical examination at a health care facility 12/05/2010  . OBSESSIVE-COMPULSIVE DISORDER 10/09/2006  . ALLERGIC RHINITIS 10/09/2006  . DERMATITIS, CONTACT, NOS 10/09/2006   Past Medical History  Diagnosis Date  . Allergy     seasonal  . Hyperlipidemia   . OCD (obsessive compulsive disorder)    Past Surgical History  Procedure Laterality Date  . Abdominal hysterectomy  2000    Partial, fibroids  . Tubal ligation  1992  . Doppler echocardiography  1997    Equivocal  . Breast surgery Left 2006  . Liposuction  2010   History  Substance Use Topics  . Smoking status: Never Smoker   . Smokeless tobacco: Never Used  . Alcohol Use: 0.0 oz/week    0 Standard drinks or equivalent per week     Comment: daily-wine/beer    Family History  Problem Relation Age of Onset  . Arthritis Mother     osteoarthritis  . Depression Mother   . Hypertension Mother   . Osteoporosis Mother   . Hyperlipidemia Father   . Heart disease Father     Bicuspid aortic valve - replaced; sick sinus syndrome  . Heart disease Paternal Grandfather     MI  . Cancer Paternal Grandfather     Renal  . Heart disease Other     Heart valve repair  .  Cancer Other     1 had Breast CA ; 2 had colon CA   Allergies  Allergen Reactions  . Penicillins     REACTION: reaction not known  . Shellfish Allergy Nausea And Vomiting  . Sulfonamide Derivatives     REACTION: reaction not known   Current Outpatient Prescriptions on File Prior to Visit  Medication Sig Dispense Refill  . Calcium Carbonate-Vitamin D (CALCIUM-VITAMIN D) 600-200 MG-UNIT CAPS Take 1 capsule by mouth 3 (three) times daily.     . Cholecalciferol (VITAMIN D) 400 UNITS capsule Take 1,200 Units by mouth daily.    Marland Kitchen. FLUoxetine (PROZAC) 20 MG tablet Take 20 mg by mouth daily.      . Nutritional Supplements (ESTROVEN PO) Take 1 tablet by mouth daily.       No current facility-administered medications on file prior to visit.      Review of Systems Review of Systems  Constitutional: Negative for fever, appetite change, fatigue and unexpected weight change.  Eyes: Negative for pain and visual disturbance.  Respiratory: Negative for cough and shortness of breath.   Cardiovascular: Negative for cp or palpitations    Gastrointestinal: Negative for nausea, diarrhea and constipation.  Genitourinary: Negative for urgency and frequency.  Skin: Negative for pallor or rash   Neurological: Negative for weakness, light-headedness, numbness and headaches.  Hematological: Negative for adenopathy. Does not bruise/bleed easily.  Psychiatric/Behavioral: Negative for dysphoric mood. The patient is not nervous/anxious.         Objective:   Physical Exam  Constitutional: She appears well-developed and well-nourished. No distress.  HENT:  Head: Normocephalic and atraumatic.  Right Ear: External ear normal.  Left Ear: External ear normal.  Mouth/Throat: Oropharynx is clear and moist.  Eyes: Conjunctivae and EOM are normal. Pupils are equal, round, and reactive to light. No scleral icterus.  Neck: Normal range of motion. Neck supple. No JVD present. Carotid bruit is not present. No  thyromegaly present.  Cardiovascular: Normal rate, regular rhythm, normal heart sounds and intact distal pulses.  Exam reveals no gallop.   Pulmonary/Chest: Effort normal  and breath sounds normal. No respiratory distress. She has no wheezes. She exhibits no tenderness.  Abdominal: Soft. Bowel sounds are normal. She exhibits no distension, no abdominal bruit and no mass. There is no tenderness.  Genitourinary: No breast swelling, tenderness, discharge or bleeding.  Breast exam: No mass, nodules, thickening, tenderness, bulging, retraction, inflamation, nipple discharge or skin changes noted.  No axillary or clavicular LA.      Musculoskeletal: Normal range of motion. She exhibits no edema or tenderness.  Lymphadenopathy:    She has no cervical adenopathy.  Neurological: She is alert. She has normal reflexes. No cranial nerve deficit. She exhibits normal muscle tone. Coordination normal.  Skin: Skin is warm and dry. No rash noted. No erythema. No pallor.  Solar lentigos diffusely   Psychiatric: She has a normal mood and affect.          Assessment & Plan:   Problem List Items Addressed This Visit      Other   Breast microcalcification, mammographic   Relevant Orders   MM Digital Diagnostic Bilat   US BREAST LTD UNI LEFT INC AXILLA   US BREAST LTD UNI RIGHT INC AXILLA   Encounter for screening mammogram for breast cancer    Due for mammogram- ordered as diagnostic in light of hx of breast calcifications and bx last year  Ref done       Routine general medical examination at a health care facility - Primary    Reviewed health habits including diet and exercise and skin cancer prevention Reviewed appropriate screening tests for age  Also reviewed health mt list, fam hx and immunization status , as well as social and family history   See HPI Labs rev Commended good health habits

## 2014-06-29 NOTE — Assessment & Plan Note (Signed)
Reviewed health habits including diet and exercise and skin cancer prevention Reviewed appropriate screening tests for age  Also reviewed health mt list, fam hx and immunization status , as well as social and family history   See HPI Labs rev Commended good health habits

## 2014-06-29 NOTE — Telephone Encounter (Signed)
I changed it 

## 2014-06-29 NOTE — Telephone Encounter (Signed)
I did it - do you think they could have told me in the first message, eh? Ridiculous

## 2014-06-29 NOTE — Assessment & Plan Note (Signed)
Due for mammogram- ordered as diagnostic in light of hx of breast calcifications and bx last year  Ref done

## 2014-06-29 NOTE — Telephone Encounter (Signed)
[facility] stated they also needs ultra sound for each breast Ultra sound ldt right and left

## 2014-07-04 NOTE — Telephone Encounter (Signed)
Appointment 5/17 pt aware

## 2014-07-18 ENCOUNTER — Ambulatory Visit: Payer: BLUE CROSS/BLUE SHIELD

## 2014-07-18 ENCOUNTER — Other Ambulatory Visit: Payer: Self-pay | Admitting: Family Medicine

## 2014-07-18 ENCOUNTER — Ambulatory Visit
Admission: RE | Admit: 2014-07-18 | Discharge: 2014-07-18 | Disposition: A | Discharge status: Home / Self Care | Payer: BLUE CROSS/BLUE SHIELD | Source: Ambulatory Visit | Attending: Family Medicine | Admitting: Family Medicine

## 2014-07-18 DIAGNOSIS — R920 Mammographic microcalcification found on diagnostic imaging of breast: Secondary | ICD-10-CM | POA: Diagnosis present

## 2014-07-18 DIAGNOSIS — R921 Mammographic calcification found on diagnostic imaging of breast: Secondary | ICD-10-CM | POA: Diagnosis not present

## 2014-09-25 ENCOUNTER — Telehealth: Payer: Self-pay | Admitting: Family Medicine

## 2014-09-25 NOTE — Telephone Encounter (Signed)
Pt dropped off re-appointment application form to be completed and signed by Dr. Milinda Antis. Please mail once completed (there is an addressed envelope attached). Please also call pt once form is complete.  [phone]  Forms in [place] inbox

## 2014-09-25 NOTE — Telephone Encounter (Signed)
Form in your inbox 

## 2014-09-26 NOTE — Telephone Encounter (Signed)
Form mailed and pt notified

## 2014-12-14 ENCOUNTER — Ambulatory Visit (INDEPENDENT_AMBULATORY_CARE_PROVIDER_SITE_OTHER): Payer: BLUE CROSS/BLUE SHIELD

## 2014-12-14 DIAGNOSIS — Z23 Encounter for immunization: Secondary | ICD-10-CM

## 2015-04-05 ENCOUNTER — Encounter: Payer: Self-pay | Admitting: Family Medicine

## 2015-04-05 NOTE — Telephone Encounter (Signed)
Called pt and rescheduled he lab appt

## 2015-06-07 ENCOUNTER — Other Ambulatory Visit: Payer: Self-pay | Admitting: Family Medicine

## 2015-06-07 DIAGNOSIS — Z1231 Encounter for screening mammogram for malignant neoplasm of breast: Secondary | ICD-10-CM

## 2015-06-17 ENCOUNTER — Telehealth: Payer: Self-pay | Admitting: Family Medicine

## 2015-06-17 DIAGNOSIS — Z0000 Encounter for general adult medical examination without abnormal findings: Secondary | ICD-10-CM

## 2015-06-17 NOTE — Telephone Encounter (Signed)
-----   Message from Alvina Chouerri J Walsh sent at 06/14/2015  2:59 PM EDT ----- Regarding: lab orders for Thursday, 4.20.17 Patient is scheduled for CPX labs, please order future labs, Thanks , Camelia Engerri

## 2015-06-21 ENCOUNTER — Other Ambulatory Visit (INDEPENDENT_AMBULATORY_CARE_PROVIDER_SITE_OTHER): Payer: BLUE CROSS/BLUE SHIELD

## 2015-06-21 DIAGNOSIS — Z0000 Encounter for general adult medical examination without abnormal findings: Secondary | ICD-10-CM

## 2015-06-21 LAB — LIPID PANEL
CHOL/HDL RATIO: 2
Cholesterol: 207 mg/dL — ABNORMAL HIGH (ref 0–200)
HDL: 83.4 mg/dL (ref >39.00)
LDL Cholesterol: 109 mg/dL — ABNORMAL HIGH (ref 0–99)
NONHDL: 123.65
TRIGLYCERIDES: 71 mg/dL (ref 0.0–149.0)
VLDL: 14.2 mg/dL (ref 0.0–40.0)

## 2015-06-21 LAB — COMPREHENSIVE METABOLIC PANEL
ALK PHOS: 58 U/L (ref 39–117)
ALT: 14 U/L (ref 0–35)
AST: 16 U/L (ref 0–37)
Albumin: 4.1 g/dL (ref 3.5–5.2)
BILIRUBIN TOTAL: 0.3 mg/dL (ref 0.2–1.2)
BUN: 18 mg/dL (ref 6–23)
CALCIUM: 9.6 mg/dL (ref 8.4–10.5)
CO2: 29 meq/L (ref 19–32)
CREATININE: 0.83 mg/dL (ref 0.40–1.20)
Chloride: 105 mEq/L (ref 96–112)
GFR: 74.56 mL/min (ref >60.00)
GLUCOSE: 101 mg/dL — AB (ref 70–99)
Potassium: 4.5 mEq/L (ref 3.5–5.1)
Sodium: 139 mEq/L (ref 135–145)
TOTAL PROTEIN: 6.6 g/dL (ref 6.0–8.3)

## 2015-06-21 LAB — CBC WITH DIFFERENTIAL/PLATELET
BASOS ABS: 0 10*3/uL (ref 0.0–0.1)
Basophils Relative: 0.8 % (ref 0.0–3.0)
EOS ABS: 0.2 10*3/uL (ref 0.0–0.7)
Eosinophils Relative: 3.6 % (ref 0.0–5.0)
HEMATOCRIT: 40.6 % (ref 36.0–46.0)
Hemoglobin: 13.6 g/dL (ref 12.0–15.0)
LYMPHS PCT: 38.2 % (ref 12.0–46.0)
Lymphs Abs: 2.4 10*3/uL (ref 0.7–4.0)
MCHC: 33.5 g/dL (ref 30.0–36.0)
MCV: 93.6 fl (ref 78.0–100.0)
MONOS PCT: 7 % (ref 3.0–12.0)
Monocytes Absolute: 0.4 10*3/uL (ref 0.1–1.0)
NEUTROS ABS: 3.1 10*3/uL (ref 1.4–7.7)
Neutrophils Relative %: 50.4 % (ref 43.0–77.0)
PLATELETS: 269 10*3/uL (ref 150.0–400.0)
RBC: 4.34 Mil/uL (ref 3.87–5.11)
RDW: 12.5 % (ref 11.5–15.5)
WBC: 6.2 10*3/uL (ref 4.0–10.5)

## 2015-06-21 LAB — TSH: TSH: 3.05 u[IU]/mL (ref 0.35–4.50)

## 2015-06-28 ENCOUNTER — Other Ambulatory Visit: Payer: BLUE CROSS/BLUE SHIELD

## 2015-07-03 ENCOUNTER — Ambulatory Visit (INDEPENDENT_AMBULATORY_CARE_PROVIDER_SITE_OTHER): Payer: BLUE CROSS/BLUE SHIELD | Admitting: Family Medicine

## 2015-07-03 ENCOUNTER — Encounter: Payer: Self-pay | Admitting: Family Medicine

## 2015-07-03 VITALS — BP 96/62 | HR 68 | Temp 98.3°F | Ht 67.0 in | Wt 138.5 lb

## 2015-07-03 DIAGNOSIS — Z0000 Encounter for general adult medical examination without abnormal findings: Secondary | ICD-10-CM | POA: Diagnosis not present

## 2015-07-03 NOTE — Assessment & Plan Note (Addendum)
Reviewed health habits including diet and exercise and skin cancer prevention Reviewed appropriate screening tests for age  Also reviewed health mt list, fam hx and immunization status , as well as social and family history   See HPI Labs reviewed-good  Disc low glycemic/sat fat diet  Good exercise  Rev coping mech for stress Mammogram planned later this month Donates blood-has been screened for HIV and Hep C

## 2015-07-03 NOTE — Progress Notes (Signed)
Subjective:    Patient ID: Donna Larson, female    DOB: 1956-02-21, 60 y.o.   MRN: [id number]  HPI Here for health maintenance exam and to review chronic medical problems    Doing ok but pretty stressed from work lately  [place]  Took time off recently - hard catching up    Wt is down 3 lb with bmi of 21 She states she is eating enough- good appetite (did just have a lot of exercise on vacation)  Also exercises regularly    BP Readings from Last 3 Encounters:  07/03/15 96/62  06/28/14 108/72  05/11/13 116/62    Hep C/HIV screening - declines because she donates blood and gets screened all the time   Mm 5/16 was a 6 mo f/u for microcalcifications (hx of dense breasts and bx in the past)  rec 1 y f/u screening bilat mm Has an appt for the 18th already  Self exam -no new lumps   Flu shot 10/16  Colonoscopy 10/08 10 year recall- she will do it   Td 12/13  dexa 1/12 normal No falls or fractures in the past year    Results for orders placed or performed in visit on 06/21/15  CBC with Differential/Platelet  Result Value Ref Range   WBC 6.2 4.0 - 10.5 K/uL   RBC 4.34 3.87 - 5.11 Mil/uL   Hemoglobin 13.6 12.0 - 15.0 g/dL   HCT 14.740.6 82.936.0 - 56.246.0 %   MCV 93.6 78.0 - 100.0 fl   MCHC 33.5 30.0 - 36.0 g/dL   RDW 13.012.5 86.511.5 - 78.415.5 %   Platelets 269.0 150.0 - 400.0 K/uL   Neutrophils Relative % 50.4 43.0 - 77.0 %   Lymphocytes Relative 38.2 12.0 - 46.0 %   Monocytes Relative 7.0 3.0 - 12.0 %   Eosinophils Relative 3.6 0.0 - 5.0 %   Basophils Relative 0.8 0.0 - 3.0 %   Neutro Abs 3.1 1.4 - 7.7 K/uL   Lymphs Abs 2.4 0.7 - 4.0 K/uL   Monocytes Absolute 0.4 0.1 - 1.0 K/uL   Eosinophils Absolute 0.2 0.0 - 0.7 K/uL   Basophils Absolute 0.0 0.0 - 0.1 K/uL  Comprehensive metabolic panel  Result Value Ref Range   Sodium 139 135 - 145 mEq/L   Potassium 4.5 3.5 - 5.1 mEq/L   Chloride 105 96 - 112 mEq/L   CO2 29 19 - 32 mEq/L   Glucose, Bld  101 (H) 70 - 99 mg/dL   BUN 18 6 - 23 mg/dL   Creatinine, Ser 6.960.83 0.40 - 1.20 mg/dL   Total Bilirubin 0.3 0.2 - 1.2 mg/dL   Alkaline Phosphatase 58 39 - 117 U/L   AST 16 0 - 37 U/L   ALT 14 0 - 35 U/L   Total Protein 6.6 6.0 - 8.3 g/dL   Albumin 4.1 3.5 - 5.2 g/dL   Calcium 9.6 8.4 - 29.510.5 mg/dL   GFR 28.4174.56 >32.44>60.00 mL/min  Lipid panel  Result Value Ref Range   Cholesterol 207 (H) 0 - 200 mg/dL   Triglycerides 01.071.0 0.0 - 149.0 mg/dL   HDL 27.2583.40 >36.64>39.00 mg/dL   VLDL 40.314.2 0.0 - 47.440.0 mg/dL   LDL Cholesterol 259109 (H) 0 - 99 mg/dL   Total CHOL/HDL Ratio 2    NonHDL 123.65   TSH  Result Value Ref Range   TSH 3.05 0.35 - 4.50 uIU/mL    She got rid of bratwurst  and some other things in her diet  Less beef   Lab Results  Component Value Date   CHOL 207* 06/21/2015   CHOL 209* 06/20/2014   CHOL 183 01/24/2013   Lab Results  Component Value Date   HDL 83.40 06/21/2015   HDL 84.90 06/20/2014   HDL 81.70 01/24/2013   Lab Results  Component Value Date   LDLCALC 109* 06/21/2015   LDLCALC 115* 06/20/2014   LDLCALC 93 01/24/2013   Lab Results  Component Value Date   TRIG 71.0 06/21/2015   TRIG 45.0 06/20/2014   TRIG 43.0 01/24/2013   Lab Results  Component Value Date   CHOLHDL 2 06/21/2015   CHOLHDL 2 06/20/2014   CHOLHDL 2 01/24/2013   Lab Results  Component Value Date   LDLDIRECT 109.0 10/30/2008   improved  Thinks she can do even a little better   Patient Active Problem List   Diagnosis Date Noted  . Breast microcalcification, mammographic 05/14/2013  . Encounter for screening mammogram for breast cancer 02/17/2011  . Routine general medical examination at a health care facility 12/05/2010  . OBSESSIVE-COMPULSIVE DISORDER 10/09/2006  . ALLERGIC RHINITIS 10/09/2006  . DERMATITIS, CONTACT, NOS 10/09/2006   Past Medical History  Diagnosis Date  . Allergy     seasonal  . Hyperlipidemia   . OCD (obsessive compulsive disorder)    Past Surgical History    Procedure Laterality Date  . Abdominal hysterectomy  2000    Partial, fibroids  . Tubal ligation  1992  . Doppler echocardiography  1997    Equivocal  . Breast surgery Left 2006  . Liposuction  2010  . Breast biopsy Left 2006    Negative  . Breast biopsy Left 3.16.15    Negative   Social History  Substance Use Topics  . Smoking status: Never Smoker   . Smokeless tobacco: Never Used  . Alcohol Use: 0.0 oz/week    0 Standard drinks or equivalent per week     Comment: daily-wine/beer    Family History  Problem Relation Age of Onset  . Arthritis Mother     osteoarthritis  . Depression Mother   . Hypertension Mother   . Osteoporosis Mother   . Hyperlipidemia Father   . Heart disease Father     Bicuspid aortic valve - replaced; sick sinus syndrome  . Heart disease Paternal Grandfather     MI  . Cancer Paternal Grandfather     Renal  . Heart disease Other     Heart valve repair  . Cancer Other     1 had Breast CA ; 2 had colon CA   Allergies  Allergen Reactions  . Penicillins     REACTION: reaction not known  . Shellfish Allergy Nausea And Vomiting  . Sulfonamide Derivatives     REACTION: reaction not known   Current Outpatient Prescriptions on File Prior to Visit  Medication Sig Dispense Refill  . Calcium Carbonate-Vitamin D (CALCIUM-VITAMIN D) 600-200 MG-UNIT CAPS Take 1 capsule by mouth 3 (three) times daily.     Marland Kitchen FLUoxetine (PROZAC) 20 MG tablet Take 20 mg by mouth daily.      . Nutritional Supplements (ESTROVEN PO) Take 1 tablet by mouth daily.       No current facility-administered medications on file prior to visit.      Review of Systems Review of Systems  Constitutional: Negative for fever, appetite change, fatigue and unexpected weight change.  Eyes: Negative for pain and visual disturbance.  Respiratory: Negative for cough and shortness of breath.   Cardiovascular: Negative for cp or palpitations    Gastrointestinal: Negative for nausea, diarrhea  and constipation.  Genitourinary: Negative for urgency and frequency.  Skin: Negative for pallor or rash   Neurological: Negative for weakness, light-headedness, numbness and headaches.  Hematological: Negative for adenopathy. Does not bruise/bleed easily.  Psychiatric/Behavioral: Negative for dysphoric mood. The patient is not nervous/anxious.  pos for stressors she thinks she is doing ok with        Objective:   Physical Exam  Constitutional: She appears well-developed and well-nourished. No distress.  Slim and well appearing   HENT:  Head: Normocephalic and atraumatic.  Right Ear: External ear normal.  Left Ear: External ear normal.  Mouth/Throat: Oropharynx is clear and moist.  Eyes: Conjunctivae and EOM are normal. Pupils are equal, round, and reactive to light. No scleral icterus.  Neck: Normal range of motion. Neck supple. No JVD present. Carotid bruit is not present. No thyromegaly present.  Cardiovascular: Normal rate, regular rhythm, normal heart sounds and intact distal pulses.  Exam reveals no gallop.   Pulmonary/Chest: Effort normal and breath sounds normal. No respiratory distress. She has no wheezes. She exhibits no tenderness.  Abdominal: Soft. Bowel sounds are normal. She exhibits no distension, no abdominal bruit and no mass. There is no tenderness.  Genitourinary: No breast swelling, tenderness, discharge or bleeding.  Breast exam: No mass, nodules, thickening, tenderness, bulging, retraction, inflamation, nipple discharge or skin changes noted.  No axillary or clavicular LA.    Very dense breasts-baseline  Musculoskeletal: Normal range of motion. She exhibits no edema or tenderness.  Lymphadenopathy:    She has no cervical adenopathy.  Neurological: She is alert. She has normal reflexes. No cranial nerve deficit. She exhibits normal muscle tone. Coordination normal.  Skin: Skin is warm and dry. No rash noted. No erythema. No pallor.  Few lentigines Fair  complexion Pt has derm appt tomorrow for skin screen  Psychiatric: She has a normal mood and affect.          Assessment & Plan:   Problem List Items Addressed This Visit      Other   Routine general medical examination at a health care facility - Primary    Reviewed health habits including diet and exercise and skin cancer prevention Reviewed appropriate screening tests for age  Also reviewed health mt list, fam hx and immunization status , as well as social and family history   See HPI Labs reviewed-good  Disc low glycemic/sat fat diet  Good exercise  Rev coping mech for stress Mammogram planned later this month Donates blood-has been screened for HIV and Hep C

## 2015-07-03 NOTE — Patient Instructions (Signed)
I'm glad you are doing well  Labs look good Take care of yourself  Get your mammogram as planned

## 2015-07-03 NOTE — Progress Notes (Signed)
Pre visit review using our clinic review tool, if applicable. No additional management support is needed unless otherwise documented below in the visit note. 

## 2015-07-04 DIAGNOSIS — D1801 Hemangioma of skin and subcutaneous tissue: Secondary | ICD-10-CM | POA: Diagnosis not present

## 2015-07-19 ENCOUNTER — Ambulatory Visit
Admission: RE | Admit: 2015-07-19 | Discharge: 2015-07-19 | Disposition: A | Discharge status: Home / Self Care | Payer: BLUE CROSS/BLUE SHIELD | Source: Ambulatory Visit | Attending: Family Medicine | Admitting: Family Medicine

## 2015-07-19 DIAGNOSIS — Z1231 Encounter for screening mammogram for malignant neoplasm of breast: Secondary | ICD-10-CM | POA: Insufficient documentation

## 2015-09-03 DIAGNOSIS — F429 Obsessive-compulsive disorder, unspecified: Secondary | ICD-10-CM | POA: Diagnosis not present

## 2015-09-14 DIAGNOSIS — H43811 Vitreous degeneration, right eye: Secondary | ICD-10-CM | POA: Diagnosis not present

## 2015-11-07 DIAGNOSIS — H43811 Vitreous degeneration, right eye: Secondary | ICD-10-CM | POA: Diagnosis not present

## 2015-12-12 ENCOUNTER — Encounter: Payer: Self-pay | Admitting: Family Medicine

## 2016-03-10 DIAGNOSIS — Z01419 Encounter for gynecological examination (general) (routine) without abnormal findings: Secondary | ICD-10-CM | POA: Diagnosis not present

## 2016-03-10 DIAGNOSIS — Z6822 Body mass index (BMI) 22.0-22.9, adult: Secondary | ICD-10-CM | POA: Diagnosis not present

## 2016-03-31 DIAGNOSIS — F429 Obsessive-compulsive disorder, unspecified: Secondary | ICD-10-CM | POA: Diagnosis not present

## 2016-06-23 ENCOUNTER — Other Ambulatory Visit: Payer: Self-pay | Admitting: Family Medicine

## 2016-06-23 DIAGNOSIS — Z1231 Encounter for screening mammogram for malignant neoplasm of breast: Secondary | ICD-10-CM

## 2016-06-29 ENCOUNTER — Telehealth: Payer: Self-pay | Admitting: Family Medicine

## 2016-06-29 DIAGNOSIS — Z0000 Encounter for general adult medical examination without abnormal findings: Secondary | ICD-10-CM

## 2016-06-29 NOTE — Telephone Encounter (Signed)
-----   Message from Baldomero Lamy sent at 06/24/2016 10:49 AM EDT ----- Regarding: Cpx labs Thurs 5/3, need orders. Thanks! :-) Please order  future cpx labs for pt's upcoming lab appt. Thanks Rodney Booze

## 2016-07-03 ENCOUNTER — Other Ambulatory Visit (INDEPENDENT_AMBULATORY_CARE_PROVIDER_SITE_OTHER): Payer: BLUE CROSS/BLUE SHIELD

## 2016-07-03 DIAGNOSIS — Z0000 Encounter for general adult medical examination without abnormal findings: Secondary | ICD-10-CM

## 2016-07-03 DIAGNOSIS — D1801 Hemangioma of skin and subcutaneous tissue: Secondary | ICD-10-CM | POA: Diagnosis not present

## 2016-07-03 LAB — CBC WITH DIFFERENTIAL/PLATELET
BASOS PCT: 0.7 % (ref 0.0–3.0)
Basophils Absolute: 0 10*3/uL (ref 0.0–0.1)
EOS ABS: 0.2 10*3/uL (ref 0.0–0.7)
Eosinophils Relative: 4.2 % (ref 0.0–5.0)
HCT: 40.2 % (ref 36.0–46.0)
HEMOGLOBIN: 13.6 g/dL (ref 12.0–15.0)
LYMPHS PCT: 36 % (ref 12.0–46.0)
Lymphs Abs: 2.1 10*3/uL (ref 0.7–4.0)
MCHC: 33.8 g/dL (ref 30.0–36.0)
MCV: 94.1 fl (ref 78.0–100.0)
Monocytes Absolute: 0.4 10*3/uL (ref 0.1–1.0)
Monocytes Relative: 6.9 % (ref 3.0–12.0)
Neutro Abs: 3.1 10*3/uL (ref 1.4–7.7)
Neutrophils Relative %: 52.2 % (ref 43.0–77.0)
Platelets: 253 10*3/uL (ref 150.0–400.0)
RBC: 4.28 Mil/uL (ref 3.87–5.11)
RDW: 12.3 % (ref 11.5–15.5)
WBC: 5.9 10*3/uL (ref 4.0–10.5)

## 2016-07-03 LAB — COMPREHENSIVE METABOLIC PANEL
ALBUMIN: 4.1 g/dL (ref 3.5–5.2)
ALK PHOS: 57 U/L (ref 39–117)
ALT: 16 U/L (ref 0–35)
AST: 17 U/L (ref 0–37)
BILIRUBIN TOTAL: 0.3 mg/dL (ref 0.2–1.2)
BUN: 25 mg/dL — ABNORMAL HIGH (ref 6–23)
CALCIUM: 9.3 mg/dL (ref 8.4–10.5)
CHLORIDE: 106 meq/L (ref 96–112)
CO2: 31 mEq/L (ref 19–32)
CREATININE: 0.84 mg/dL (ref 0.40–1.20)
GFR: 73.28 mL/min (ref >60.00)
Glucose, Bld: 105 mg/dL — ABNORMAL HIGH (ref 70–99)
Potassium: 4.6 mEq/L (ref 3.5–5.1)
Sodium: 140 mEq/L (ref 135–145)
TOTAL PROTEIN: 6.5 g/dL (ref 6.0–8.3)

## 2016-07-03 LAB — LIPID PANEL
CHOLESTEROL: 202 mg/dL — AB (ref 0–200)
HDL: 86.2 mg/dL (ref >39.00)
LDL Cholesterol: 104 mg/dL — ABNORMAL HIGH (ref 0–99)
NonHDL: 115.81
TRIGLYCERIDES: 59 mg/dL (ref 0.0–149.0)
Total CHOL/HDL Ratio: 2
VLDL: 11.8 mg/dL (ref 0.0–40.0)

## 2016-07-03 LAB — TSH: TSH: 2.34 u[IU]/mL (ref 0.35–4.50)

## 2016-07-07 ENCOUNTER — Encounter: Payer: BLUE CROSS/BLUE SHIELD | Admitting: Family Medicine

## 2016-07-08 ENCOUNTER — Ambulatory Visit (INDEPENDENT_AMBULATORY_CARE_PROVIDER_SITE_OTHER): Payer: BLUE CROSS/BLUE SHIELD | Admitting: Family Medicine

## 2016-07-08 ENCOUNTER — Encounter: Payer: Self-pay | Admitting: Family Medicine

## 2016-07-08 VITALS — BP 122/64 | HR 69 | Temp 98.4°F | Ht 67.0 in | Wt 137.8 lb

## 2016-07-08 DIAGNOSIS — F429 Obsessive-compulsive disorder, unspecified: Secondary | ICD-10-CM | POA: Diagnosis not present

## 2016-07-08 DIAGNOSIS — Z0000 Encounter for general adult medical examination without abnormal findings: Secondary | ICD-10-CM

## 2016-07-08 NOTE — Progress Notes (Signed)
Subjective:    Patient ID: Donna Larson, female    DOB: June 27, 1955, 61 y.o.   MRN: [id number]  HPI  Here for health maintenance exam and to review chronic medical problems    Kind of stressed right now  Short staffed at work - up and down  Work load is high  Starting to get better  Her June vacation was taken away , so she is going away next week  Making herself walk in the am before work  Hard to do it after  [employer] Corporation[employer]els better and more energy overall    Wt Readings from Last 3 Encounters:  07/08/16 137 lb 12 oz (62.5 kg)  07/03/15 138 lb 8 oz (62.8 kg)  06/28/14 141 lb 8 oz (64.2 kg)  appetite is pretty good overall  (used to binge-that is improved) Exercising  bmi 21.5  Mammogram 5/17 neg- she has that appt  Self breast exam- no lumps/changes   Flu shot 10/17  Colonoscopy 10/08 normal She will get that this fall  No stool changes overall    Tetanus shot 12/13  Hep C/HIV screen completed   dexa 1/12- BMD in the normal range Mother had OP No broken bones Had a trip and fall/ nothing hurt   Hx of hysterectomy in the past  No gyn symptoms or problems  No fullness or pain or vaginal symptoms Had her gyn check up in January     Hx of OCD- takes fluoxetine- still works for [employer]   Keeping up with eye appts- has a floater they are watching   Labs: Results for orders placed or performed in visit on 07/03/16  CBC with Differential/Platelet  Result Value Ref Range   WBC 5.9 4.0 - 10.5 K/uL   RBC 4.28 3.87 - 5.11 Mil/uL   Hemoglobin 13.6 12.0 - 15.0 g/dL   HCT 78.240.2 95.636.0 - 21.346.0 %   MCV 94.1 78.0 - 100.0 fl   MCHC 33.8 30.0 - 36.0 g/dL   RDW 08.612.3 57.811.5 - 46.915.5 %   Platelets 253.0 150.0 - 400.0 K/uL   Neutrophils Relative % 52.2 43.0 - 77.0 %   Lymphocytes Relative 36.0 12.0 - 46.0 %   Monocytes Relative 6.9 3.0 - 12.0 %   Eosinophils Relative 4.2 0.0 - 5.0 %   Basophils Relative 0.7 0.0 - 3.0 %   Neutro Abs 3.1 1.4 - 7.7 K/uL   Lymphs Abs 2.1 0.7 - 4.0 K/uL     Monocytes Absolute 0.4 0.1 - 1.0 K/uL   Eosinophils Absolute 0.2 0.0 - 0.7 K/uL   Basophils Absolute 0.0 0.0 - 0.1 K/uL  Comprehensive metabolic panel  Result Value Ref Range   Sodium 140 135 - 145 mEq/L   Potassium 4.6 3.5 - 5.1 mEq/L   Chloride 106 96 - 112 mEq/L   CO2 31 19 - 32 mEq/L   Glucose, Bld 105 (H) 70 - 99 mg/dL   BUN 25 (H) 6 - 23 mg/dL   Creatinine, Ser 6.290.84 0.40 - 1.20 mg/dL   Total Bilirubin 0.3 0.2 - 1.2 mg/dL   Alkaline Phosphatase 57 39 - 117 U/L   AST 17 0 - 37 U/L   ALT 16 0 - 35 U/L   Total Protein 6.5 6.0 - 8.3 g/dL   Albumin 4.1 3.5 - 5.2 g/dL   Calcium 9.3 8.4 - 52.810.5 mg/dL   GFR 41.3273.28 >44.01>60.00 mL/min  Lipid panel  Result Value Ref Range   Cholesterol 202 (H) 0 - 200  mg/dL   Triglycerides 16.1 0.0 - 149.0 mg/dL   HDL 09.60 >45.40 mg/dL   VLDL 98.1 0.0 - 19.1 mg/dL   LDL Cholesterol 478 (H) 0 - 99 mg/dL   Total CHOL/HDL Ratio 2    NonHDL 115.81   TSH  Result Value Ref Range   TSH 2.34 0.35 - 4.50 uIU/mL    Great cholesterol profile with high HDL !  Eating well   Patient Active Problem List   Diagnosis Date Noted  . Breast microcalcification, mammographic 05/14/2013  . Encounter for screening mammogram for breast cancer 02/17/2011  . Routine general medical examination at a health care facility 12/05/2010  . OBSESSIVE-COMPULSIVE DISORDER 10/09/2006  . ALLERGIC RHINITIS 10/09/2006  . DERMATITIS, CONTACT, NOS 10/09/2006   Past Medical History:  Diagnosis Date  . Allergy    seasonal  . Hyperlipidemia   . OCD (obsessive compulsive disorder)    Past Surgical History:  Procedure Laterality Date  . ABDOMINAL HYSTERECTOMY  2000   Partial, fibroids  . BREAST BIOPSY Left 2006   Negative  . BREAST BIOPSY Left 3.16.15   Negative  . BREAST SURGERY Left 2006  . DOPPLER ECHOCARDIOGRAPHY  1997   Equivocal  . LIPOSUCTION  2010  . TUBAL LIGATION  1992   Social History  Substance Use Topics  . Smoking status: Never Smoker  . Smokeless tobacco:  Never Used  . Alcohol use 0.0 oz/week     Comment: daily-wine/beer    Family History  Problem Relation Age of Onset  . Arthritis Mother     osteoarthritis  . Depression Mother   . Hypertension Mother   . Osteoporosis Mother   . Hyperlipidemia Father   . Heart disease Father     Bicuspid aortic valve - replaced; sick sinus syndrome  . Heart disease Paternal Grandfather     MI  . Cancer Paternal Grandfather     Renal  . Heart disease Other     Heart valve repair  . Cancer Other     1 had Breast CA ; 2 had colon CA  . Breast cancer Neg Hx    Allergies  Allergen Reactions  . Penicillins     REACTION: reaction not known  . Shellfish Allergy Nausea And Vomiting  . Sulfonamide Derivatives     REACTION: reaction not known   Current Outpatient Prescriptions on File Prior to Visit  Medication Sig Dispense Refill  . Calcium Carbonate-Vitamin D (CALCIUM-VITAMIN D) 600-200 MG-UNIT CAPS Take 1 capsule by mouth 3 (three) times daily.     Marland Kitchen FLUoxetine (PROZAC) 20 MG tablet Take 20 mg by mouth daily.      . Nutritional Supplements (ESTROVEN PO) Take 1 tablet by mouth daily.       No current facility-administered medications on file prior to visit.     Review of Systems Review of Systems  Constitutional: Negative for fever, appetite change, fatigue and unexpected weight change.  Eyes: Negative for pain and visual disturbance.  Respiratory: Negative for cough and shortness of breath.   Cardiovascular: Negative for cp or palpitations    Gastrointestinal: Negative for nausea, diarrhea and constipation.  Genitourinary: Negative for urgency and frequency.  Skin: Negative for pallor or rash  (had her routine derm visit recently with no moles removed)  Neurological: Negative for weakness, light-headedness, numbness and headaches.  Hematological: Negative for adenopathy. Does not bruise/bleed easily.  Psychiatric/Behavioral: Negative for dysphoric mood. The patient is not nervous/anxious.   pos for stressors  Objective:   Physical Exam  Constitutional: She appears well-developed and well-nourished. No distress.  Well appearing  HENT:  Head: Normocephalic and atraumatic.  Right Ear: External ear normal.  Left Ear: External ear normal.  Mouth/Throat: Oropharynx is clear and moist.  Eyes: Conjunctivae and EOM are normal. Pupils are equal, round, and reactive to light. No scleral icterus.  Neck: Normal range of motion. Neck supple. No JVD present. Carotid bruit is not present. No thyromegaly present.  Cardiovascular: Normal rate, regular rhythm, normal heart sounds and intact distal pulses.  Exam reveals no gallop.   Pulmonary/Chest: Effort normal and breath sounds normal. No respiratory distress. She has no wheezes. She exhibits no tenderness.  Abdominal: Soft. Bowel sounds are normal. She exhibits no distension, no abdominal bruit and no mass. There is no tenderness.  Genitourinary: No breast swelling, tenderness, discharge or bleeding.  Genitourinary Comments: Breast exam: No mass, nodules, thickening, tenderness, bulging, retraction, inflamation, nipple discharge or skin changes noted.  No axillary or clavicular LA.      (she goes to gyn for pelvic exam)  Musculoskeletal: Normal range of motion. She exhibits no edema or tenderness.  Lymphadenopathy:    She has no cervical adenopathy.  Neurological: She is alert. She has normal reflexes. No cranial nerve deficit. She exhibits normal muscle tone. Coordination normal.  Skin: Skin is warm and dry. No rash noted. No erythema. No pallor.  Fair complexion with some lentigines   Psychiatric: She has a normal mood and affect.          Assessment & Plan:   Problem List Items Addressed This Visit      Other   Obsessive-compulsive disorder    Stable on fluoxetine Sees psychiatry  More stress at work lately- handling it well       Routine general medical examination at a health care facility - Primary    Reviewed  health habits including diet and exercise and skin cancer prevention Reviewed appropriate screening tests for age  Also reviewed health mt list, fam hx and immunization status , as well as social and family history   See HPI Labs reviewed  Commended good health habits Mammogram planned  Colonoscopy due in oct- too early to schedule/pt aware  utd gyn and psych and derm care  Rev low glycemic diet for glucose 105-she does well with this

## 2016-07-08 NOTE — Assessment & Plan Note (Signed)
Reviewed health habits including diet and exercise and skin cancer prevention Reviewed appropriate screening tests for age  Also reviewed health mt list, fam hx and immunization status , as well as social and family history   See HPI Labs reviewed  Commended good health habits Mammogram planned  Colonoscopy due in oct- too early to schedule/pt aware  utd gyn and psych and derm care  Rev low glycemic diet for glucose 105-she does well with this

## 2016-07-08 NOTE — Progress Notes (Signed)
Pre visit review using our clinic review tool, if applicable. No additional management support is needed unless otherwise documented below in the visit note. 

## 2016-07-08 NOTE — Assessment & Plan Note (Signed)
Stable on fluoxetine Sees psychiatry  More stress at work lately- handling it well

## 2016-07-08 NOTE — Patient Instructions (Addendum)
If you need a referral for your colonoscopy- let us know  You should get something in the mail as a reminder  Take care of yourself  Keep walking and don't forget to drink water  Labs look good  Get your mammogram as planned

## 2016-07-21 ENCOUNTER — Ambulatory Visit
Admission: RE | Admit: 2016-07-21 | Discharge: 2016-07-21 | Disposition: A | Discharge status: Home / Self Care | Payer: BLUE CROSS/BLUE SHIELD | Source: Ambulatory Visit | Attending: Family Medicine | Admitting: Family Medicine

## 2016-07-21 DIAGNOSIS — Z1231 Encounter for screening mammogram for malignant neoplasm of breast: Secondary | ICD-10-CM | POA: Diagnosis not present

## 2016-09-08 IMAGING — MG MM DIGITAL SCREENING BILAT W/ CAD
4 series · 4 of 4 positions shown · non-contrast
Comparison: Previous exam(s).

CLINICAL DATA: Screening.

EXAM:
DIGITAL SCREENING BILATERAL MAMMOGRAM WITH CAD

[L CC]
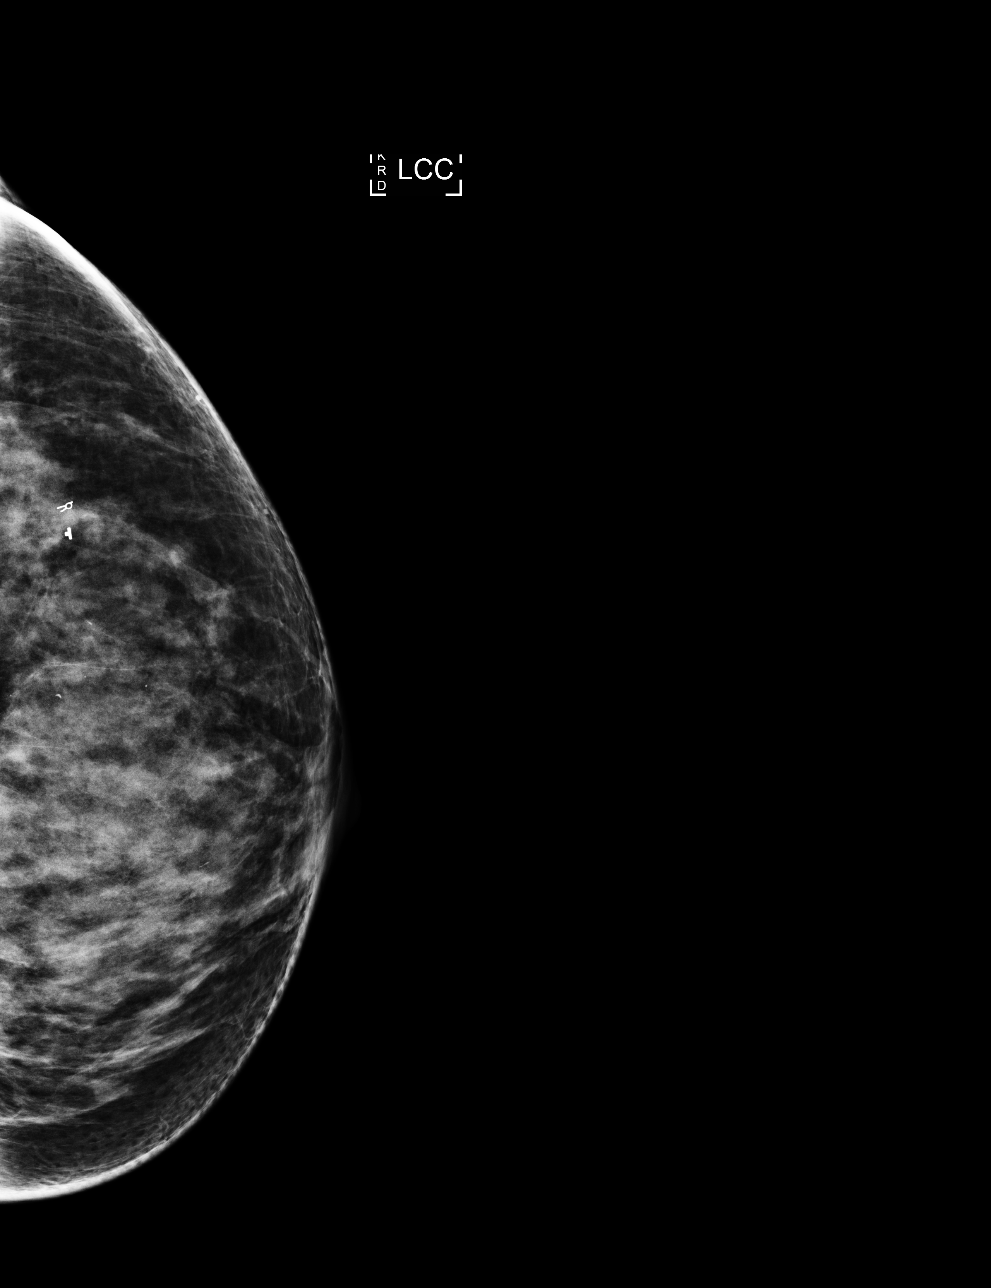

[L MLO]
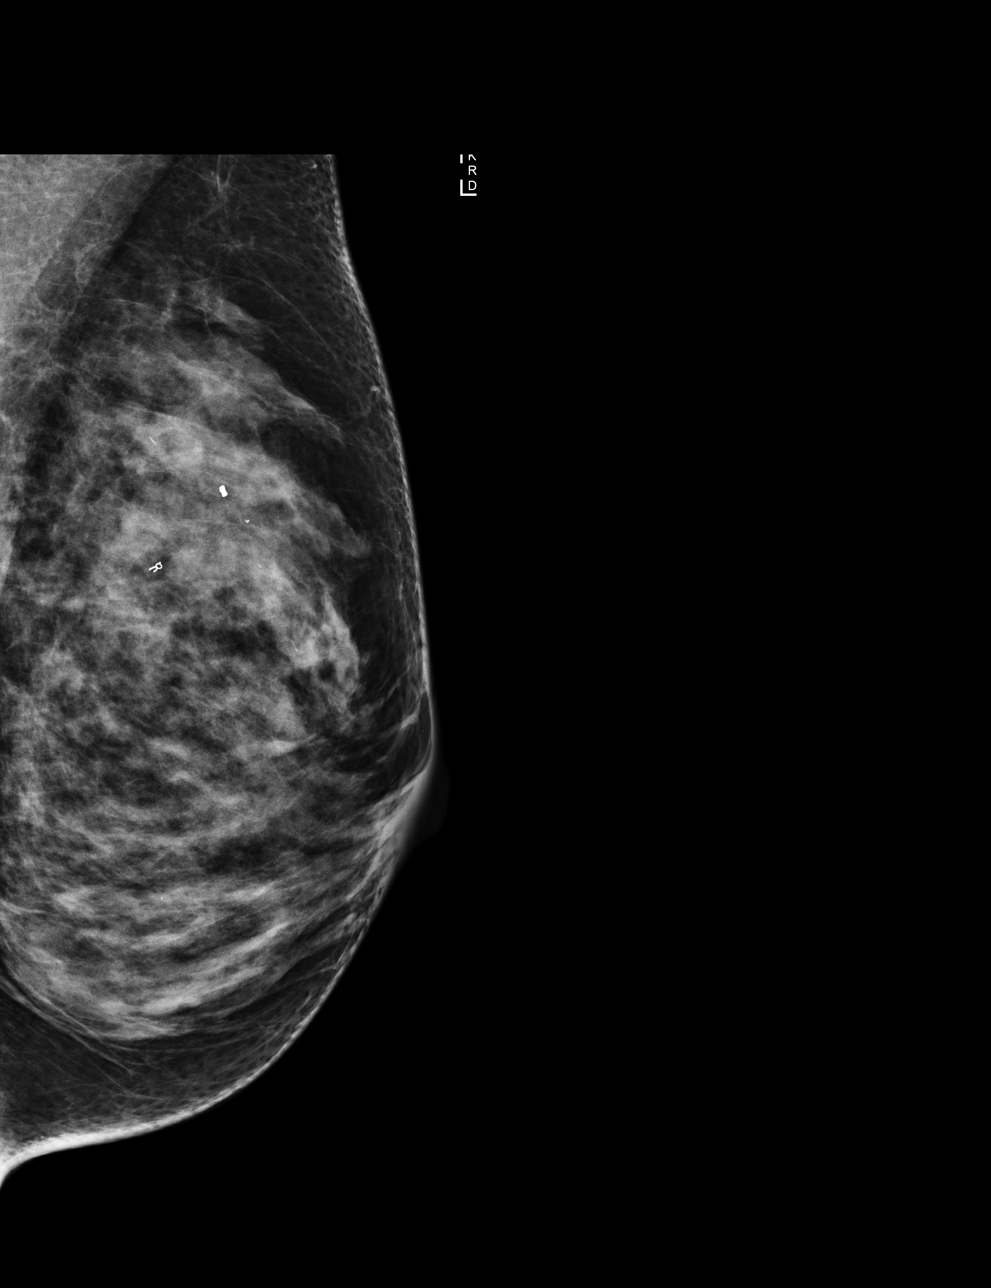

[R CC]
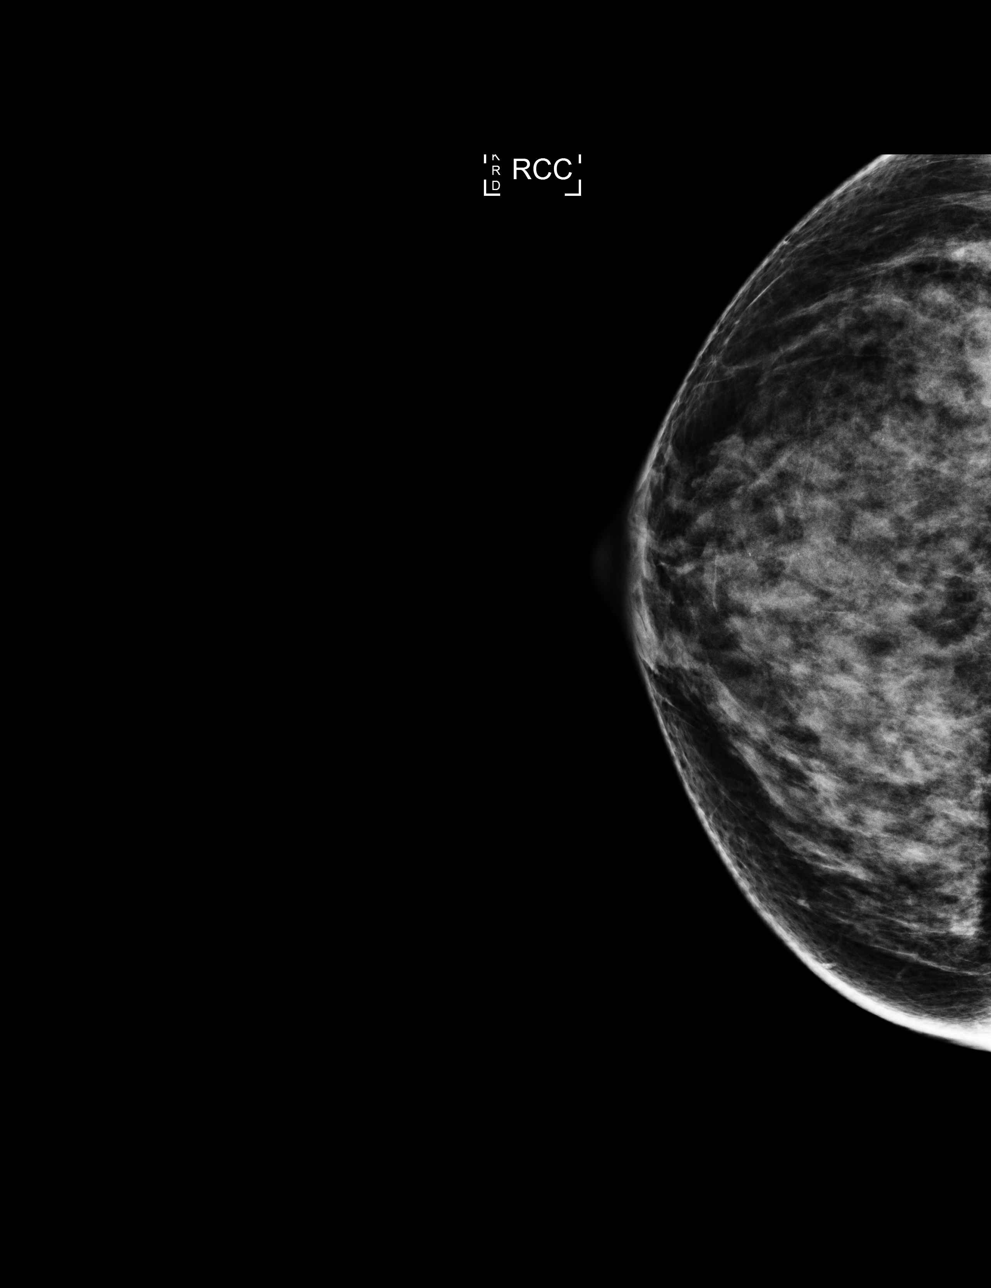

[R MLO]
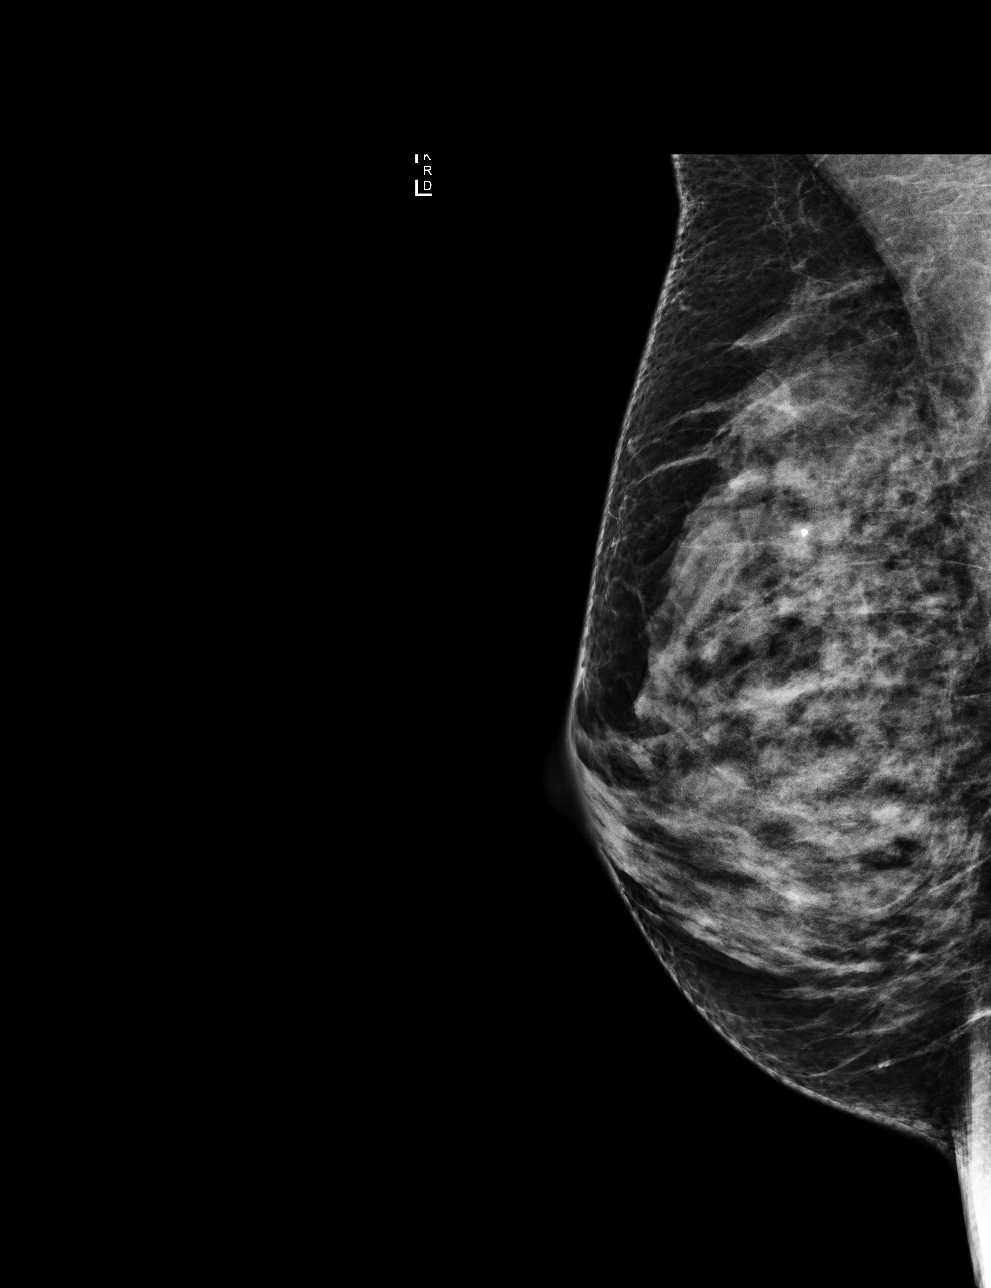

[4 of 4 positions shown; findings below may reference images not displayed]

ACR Breast Density Category c: The breast tissue is heterogeneously
dense, which may obscure small masses.
FINDINGS: There are no findings suspicious for malignancy. Images were
processed with CAD.
IMPRESSION: No mammographic evidence of malignancy. A result letter of this
screening mammogram will be mailed directly to the patient.

RECOMMENDATION:
Screening mammogram in one year. (Code:YJ-[id number])

BI-RADS CATEGORY  1: Negative.

## 2016-10-02 DIAGNOSIS — F429 Obsessive-compulsive disorder, unspecified: Secondary | ICD-10-CM | POA: Diagnosis not present

## 2016-10-27 ENCOUNTER — Ambulatory Visit (INDEPENDENT_AMBULATORY_CARE_PROVIDER_SITE_OTHER): Payer: BLUE CROSS/BLUE SHIELD | Admitting: General Surgery

## 2016-10-27 ENCOUNTER — Encounter: Payer: Self-pay | Admitting: General Surgery

## 2016-10-27 VITALS — BP 118/68 | HR 61 | Resp 12 | Ht 68.0 in | Wt 138.0 lb

## 2016-10-27 DIAGNOSIS — Z1211 Encounter for screening for malignant neoplasm of colon: Secondary | ICD-10-CM

## 2016-10-27 MED ORDER — POLYETHYLENE GLYCOL 3350 17 GM/SCOOP PO POWD: ORAL | 0 refills | Status: DC

## 2016-10-27 NOTE — Patient Instructions (Signed)
Colonoscopy, Adult A colonoscopy is an exam to look at the entire large intestine. During the exam, a lubricated, bendable tube is inserted into the anus and then passed into the rectum, colon, and other parts of the large intestine. A colonoscopy is often done as a part of normal colorectal screening or in response to certain symptoms, such as anemia, persistent diarrhea, abdominal pain, and blood in the stool. The exam can help screen for and diagnose medical problems, including:  Tumors.  Polyps.  Inflammation.  Areas of bleeding.  Tell a health care provider about:  Any allergies you have.  All medicines you are taking, including vitamins, herbs, eye drops, creams, and over-the-counter medicines.  Any problems you or family members have had with anesthetic medicines.  Any blood disorders you have.  Any surgeries you have had.  Any medical conditions you have.  Any problems you have had passing stool. What are the risks? Generally, this is a safe procedure. However, problems may occur, including:  Bleeding.  A tear in the intestine.  A reaction to medicines given during the exam.  Infection (rare).  What happens before the procedure? Eating and drinking restrictions Follow instructions from your health care provider about eating and drinking, which may include:  A few days before the procedure - follow a low-fiber diet. Avoid nuts, seeds, dried fruit, raw fruits, and vegetables.  1-3 days before the procedure - follow a clear liquid diet. Drink only clear liquids, such as clear broth or bouillon, black coffee or tea, clear juice, clear soft drinks or sports drinks, gelatin dessert, and popsicles. Avoid any liquids that contain red or purple dye.  On the day of the procedure - do not eat or drink anything during the 2 hours before the procedure, or within the time period that your health care provider recommends.  Bowel prep If you were prescribed an oral bowel prep  to clean out your colon:  Take it as told by your health care provider. Starting the day before your procedure, you will need to drink a large amount of medicated liquid. The liquid will cause you to have multiple loose stools until your stool is almost clear or light green.  If your skin or anus gets irritated from diarrhea, you may use these to relieve the irritation: ? Medicated wipes, such as adult wet wipes with aloe and vitamin E. ? A skin soothing-product like petroleum jelly.  If you vomit while drinking the bowel prep, take a break for up to 60 minutes and then begin the bowel prep again. If vomiting continues and you cannot take the bowel prep without vomiting, call your health care provider.  General instructions  Ask your health care provider about changing or stopping your regular medicines. This is especially important if you are taking diabetes medicines or blood thinners.  Plan to have someone take you home from the hospital or clinic. What happens during the procedure?  An IV tube may be inserted into one of your veins.  You will be given medicine to help you relax (sedative).  To reduce your risk of infection: ? Your health care team will wash or sanitize their hands. ? Your anal area will be washed with soap.  You will be asked to lie on your side with your knees bent.  Your health care provider will lubricate a long, thin, flexible tube. The tube will have a camera and a light on the end.  The tube will be inserted into your   anus.  The tube will be gently eased through your rectum and colon.  Air will be delivered into your colon to keep it open. You may feel some pressure or cramping.  The camera will be used to take images during the procedure.  A small tissue sample may be removed from your body to be examined under a microscope (biopsy). If any potential problems are found, the tissue will be sent to a lab for testing.  If small polyps are found, your  health care provider may remove them and have them checked for cancer cells.  The tube that was inserted into your anus will be slowly removed. The procedure may vary among health care providers and hospitals. What happens after the procedure?  Your blood pressure, heart rate, breathing rate, and blood oxygen level will be monitored until the medicines you were given have worn off.  Do not drive for 24 hours after the exam.  You may have a small amount of blood in your stool.  You may pass gas and have mild abdominal cramping or bloating due to the air that was used to inflate your colon during the exam.  It is up to you to get the results of your procedure. Ask your health care provider, or the department performing the procedure, when your results will be ready. This information is not intended to replace advice given to you by your health care provider. Make sure you discuss any questions you have with your health care provider. Document Released: 02/15/2000 Document Revised: 12/19/2015 Document Reviewed: 05/01/2015 [employer] Interactive Patient Education  2018 [employer]  

## 2016-10-27 NOTE — Progress Notes (Signed)
Patient ID: Donna Larson, female   DOB: [birth year], 61 y.o.   MRN: [id number]  Chief Complaint  Patient presents with  . Colonoscopy    HPI [name] is a 61 y.o. female here today for a evaluation. of a screening colonoscopy. Last colonoscopy was done 12/07/2006. In summer time, she has more episodes of diarrhea. Otherwide, she normally moves her bowels daily.  HPI  Past Medical History:  Diagnosis Date  . Allergy    seasonal  . Hyperlipidemia   . OCD (obsessive compulsive disorder)     Past Surgical History:  Procedure Laterality Date  . ABDOMINAL HYSTERECTOMY  2000   Partial, fibroids  . BREAST BIOPSY Left 2006   Negative  . BREAST BIOPSY Left 2015   Negative  . BREAST SURGERY Left 2006  . COLONOSCOPY  12/2006  . DOPPLER ECHOCARDIOGRAPHY  1997   Equivocal  . LIPOSUCTION  2010  . TUBAL LIGATION  1992    Family History  Problem Relation Age of Onset  . Arthritis Mother        osteoarthritis  . Depression Mother   . Hypertension Mother   . Osteoporosis Mother   . Hyperlipidemia Father   . Heart disease Father        Bicuspid aortic valve - replaced; sick sinus syndrome  . Heart disease Other        Heart valve repair  . Cancer Other        1 had Breast CA ; 2 had colon CA  . Heart disease Paternal Grandfather        MI  . Cancer Paternal Grandfather        Renal  . Breast cancer Neg Hx     Social History Social History  Substance Use Topics  . Smoking status: Never Smoker  . Smokeless tobacco: Never Used  . Alcohol use 0.0 oz/week     Comment: daily-wine/beer     Allergies  Allergen Reactions  . Penicillins     REACTION: reaction not known  . Shellfish Allergy Nausea And Vomiting  . Sulfonamide Derivatives     REACTION: reaction not known    Current Outpatient Prescriptions  Medication Sig Dispense Refill  . Calcium Carbonate-Vitamin D (CALCIUM-VITAMIN D) 600-200 MG-UNIT CAPS Take 1 capsule by mouth 3 (three) times daily.     .  cromolyn (NASALCROM) 5.2 MG/ACT nasal spray Place 1 spray into both nostrils 3 (three) times daily.    Marland Kitchen FLUoxetine (PROZAC) 20 MG tablet Take 20 mg by mouth daily.      . Nutritional Supplements (ESTROVEN PO) Take 1 tablet by mouth daily.      Anson Fret 10 MCG TABS vaginal tablet Place 1 suppository vaginally 2 (two) times a week.  11  . polyethylene glycol powder (GLYCOLAX/MIRALAX) powder 255 grams one bottle for colonoscopy prep 255 g 0   No current facility-administered medications for this visit.     Review of Systems Review of Systems  Constitutional: Negative.   Respiratory: Negative.   Cardiovascular: Negative.   Gastrointestinal: Negative.     Blood pressure 118/68, pulse 61, resp. rate 12, height 5\' 8"  (1.727 m), weight 138 lb (62.6 kg).  Physical Exam Physical Exam  Constitutional: She appears well-developed and well-nourished.  HENT:  Head: Normocephalic.  Cardiovascular: Normal rate and regular rhythm.   Pulmonary/Chest: Effort normal and breath sounds normal.    Data Reviewed 12/07/2006 colonoscopy: Normal.  Assessment    Candidate for screening colonoscopy.  Plan        Colonoscopy with possible biopsy/polypectomy prn: Information regarding the procedure, including its potential risks and complications (including but not limited to perforation of the bowel, which may require emergency surgery to repair, and bleeding) was verbally given to the patient. Educational information regarding lower intestinal endoscopy was given to the patient. Written instructions for how to complete the bowel prep using Miralax were provided. The importance of drinking ample fluids to avoid dehydration as a result of the prep emphasized.  HPI, Physical Exam, Assessment and Plan have been scribed under the direction and in the presence of [name], MD.  Ples Specter, CMA  I have completed the exam and reviewed the above documentation for accuracy and completeness.  I  agree with the above.  Museum/gallery conservator has been used and any errors in dictation or transcription are unintentional.  [name], M.D., F.A.C.S.   [name] 10/28/2016, 3:55 PM   Patient has been scheduled for a colonoscopy on 12-24-16 at [facility] (per patient's request). Miralax prescription has been sent in to the patient's pharmacy today. Colonoscopy instructions have been reviewed with the patient. This patient is aware to call the office if they have further questions.   [name], CMA

## 2016-12-09 DIAGNOSIS — H5213 Myopia, bilateral: Secondary | ICD-10-CM | POA: Diagnosis not present

## 2016-12-11 ENCOUNTER — Ambulatory Visit (INDEPENDENT_AMBULATORY_CARE_PROVIDER_SITE_OTHER): Payer: BLUE CROSS/BLUE SHIELD

## 2016-12-11 DIAGNOSIS — Z23 Encounter for immunization: Secondary | ICD-10-CM

## 2016-12-17 ENCOUNTER — Encounter: Payer: Self-pay | Admitting: *Deleted

## 2016-12-23 ENCOUNTER — Encounter: Payer: Self-pay | Admitting: *Deleted

## 2016-12-24 ENCOUNTER — Ambulatory Visit
Admission: RE | Admit: 2016-12-24 | Discharge: 2016-12-24 | Disposition: A | Discharge status: Home / Self Care | Payer: BLUE CROSS/BLUE SHIELD | Source: Ambulatory Visit | Attending: General Surgery | Admitting: General Surgery

## 2016-12-24 ENCOUNTER — Ambulatory Visit: Payer: BLUE CROSS/BLUE SHIELD | Admitting: Anesthesiology

## 2016-12-24 ENCOUNTER — Ambulatory Visit: Admit: 2016-12-24 | Payer: BLUE CROSS/BLUE SHIELD | Admitting: General Surgery

## 2016-12-24 ENCOUNTER — Encounter
Admission: RE | Disposition: A | Discharge status: Home / Self Care | Payer: Self-pay | Source: Ambulatory Visit | Attending: General Surgery

## 2016-12-24 DIAGNOSIS — E785 Hyperlipidemia, unspecified: Secondary | ICD-10-CM | POA: Diagnosis not present

## 2016-12-24 DIAGNOSIS — Z90711 Acquired absence of uterus with remaining cervical stump: Secondary | ICD-10-CM | POA: Diagnosis not present

## 2016-12-24 DIAGNOSIS — F419 Anxiety disorder, unspecified: Secondary | ICD-10-CM | POA: Diagnosis not present

## 2016-12-24 DIAGNOSIS — Z79899 Other long term (current) drug therapy: Secondary | ICD-10-CM | POA: Diagnosis not present

## 2016-12-24 DIAGNOSIS — Q438 Other specified congenital malformations of intestine: Secondary | ICD-10-CM | POA: Diagnosis not present

## 2016-12-24 DIAGNOSIS — Z882 Allergy status to sulfonamides status: Secondary | ICD-10-CM | POA: Diagnosis not present

## 2016-12-24 DIAGNOSIS — F429 Obsessive-compulsive disorder, unspecified: Secondary | ICD-10-CM | POA: Diagnosis not present

## 2016-12-24 DIAGNOSIS — Z91013 Allergy to seafood: Secondary | ICD-10-CM | POA: Insufficient documentation

## 2016-12-24 DIAGNOSIS — Z1211 Encounter for screening for malignant neoplasm of colon: Secondary | ICD-10-CM | POA: Diagnosis not present

## 2016-12-24 DIAGNOSIS — Z880 Allergy status to penicillin: Secondary | ICD-10-CM | POA: Diagnosis not present

## 2016-12-24 HISTORY — PX: COLONOSCOPY WITH PROPOFOL: SHX5780

## 2016-12-24 SURGERY — COLONOSCOPY WITH PROPOFOL
Anesthesia: General

## 2016-12-24 MED ORDER — MIDAZOLAM HCL 2 MG/2ML IJ SOLN
INTRAMUSCULAR | Status: AC
  Filled 2016-12-24: qty 2

## 2016-12-24 MED ORDER — MIDAZOLAM HCL 2 MG/2ML IJ SOLN
INTRAMUSCULAR | Status: DC | PRN
  Administered 2016-12-24 (×2): 1 mg via INTRAVENOUS

## 2016-12-24 MED ORDER — SODIUM CHLORIDE 0.9 % IV SOLN
INTRAVENOUS | Status: DC
  Administered 2016-12-24: 1000 mL via INTRAVENOUS

## 2016-12-24 MED ORDER — PROPOFOL 500 MG/50ML IV EMUL
INTRAVENOUS | Status: DC | PRN
  Administered 2016-12-24: 120 ug/kg/min via INTRAVENOUS

## 2016-12-24 MED ORDER — LIDOCAINE HCL (PF) 2 % IJ SOLN
INTRAMUSCULAR | Status: AC
  Filled 2016-12-24: qty 10

## 2016-12-24 MED ORDER — FENTANYL CITRATE (PF) 100 MCG/2ML IJ SOLN
INTRAMUSCULAR | Status: DC | PRN
Start: 2016-12-24 — End: 2016-12-24
  Administered 2016-12-24 (×2): 50 ug via INTRAVENOUS

## 2016-12-24 MED ORDER — PROPOFOL 10 MG/ML IV BOLUS
INTRAVENOUS | Status: DC | PRN
Start: 2016-12-24 — End: 2016-12-24
  Administered 2016-12-24: 30 mg via INTRAVENOUS

## 2016-12-24 MED ORDER — PROPOFOL 500 MG/50ML IV EMUL
INTRAVENOUS | Status: AC
  Filled 2016-12-24: qty 50

## 2016-12-24 MED ORDER — FENTANYL CITRATE (PF) 100 MCG/2ML IJ SOLN
INTRAMUSCULAR | Status: AC
  Filled 2016-12-24: qty 2

## 2016-12-24 NOTE — Op Note (Signed)
[facility] Gastroenterology Patient Name: Donna Larson Procedure Date: 12/24/2016 7:33 AM MRN: [id number] Account #: [id number] Date of Birth: 29-Sep-1955 Admit Type: Outpatient Age: 61 Room: [facility] ENDO ROOM 1 Gender: Female Note Status: Finalized Procedure:            Colonoscopy Indications:          Screening for colorectal malignant neoplasm Providers:            [name], MD Referring MD:         Audrie Gallus. Tower (Referring MD) Medicines:            Monitored Anesthesia Care Complications:        No immediate complications. Procedure:            Pre-Anesthesia Assessment:                       - Prior to the procedure, a History and Physical was                        performed, and patient medications, allergies and                        sensitivities were reviewed. The patient's tolerance of                        previous anesthesia was reviewed.                       - The risks and benefits of the procedure and the                        sedation options and risks were discussed with the                        patient. All questions were answered and informed                        consent was obtained.                       After obtaining informed consent, the colonoscope was                        passed under direct vision. Throughout the procedure,                        the patient's blood pressure, pulse, and oxygen                        saturations were monitored continuously. The                        Colonoscope was introduced through the anus and                        advanced to the the cecum, identified by appendiceal                        orifice and ileocecal valve. The colonoscopy was  somewhat difficult due to significant looping.                        Successful completion of the procedure was aided by                        changing the patient to a supine position. The patient     tolerated the procedure well. The quality of the bowel                        preparation was excellent. Findings:      The entire examined colon appeared normal on direct and retroflexion       views. Impression:           - The entire examined colon is normal on direct and                        retroflexion views.                       - No specimens collected. Recommendation:       - Discharge patient to home (via wheelchair).                       - Repeat colonoscopy in 10 years for screening purposes. Procedure Code(s):    --- Professional ---                       [id number], Colonoscopy, flexible; diagnostic, including                        collection of specimen(s) by brushing or washing, when                        performed (separate procedure) Diagnosis Code(s):    --- Professional ---                       Z12.11, Encounter for screening for malignant neoplasm                        of colon CPT copyright 2016 American Medical Association. All rights reserved. The codes documented in this report are preliminary and upon coder review may  be revised to meet current compliance requirements. Earline MayotteJeffrey W. Kyiesha Millward, MD 12/24/2016 8:05:47 AM This report has been signed electronically. Number of Addenda: 0 Note Initiated On: 12/24/2016 7:33 AM Scope Withdrawal Time: 0 hours 8 minutes 16 seconds  Total Procedure Duration: 0 hours 19 minutes 3 seconds       [facility]

## 2016-12-24 NOTE — Anesthesia Preprocedure Evaluation (Signed)
Anesthesia Evaluation  Patient identified by MRN, date of birth, ID band Patient awake    Reviewed: Allergy & Precautions, NPO status , Patient's Chart, lab work & pertinent test results  Airway Mallampati: II       Dental  (+) Teeth Intact   Pulmonary neg pulmonary ROS,    breath sounds clear to auscultation       Cardiovascular Exercise Tolerance: Good  Rhythm:Regular     Neuro/Psych Anxiety negative neurological ROS     GI/Hepatic negative GI ROS, Neg liver ROS,   Endo/Other  negative endocrine ROS  Renal/GU negative Renal ROS  negative genitourinary   Musculoskeletal negative musculoskeletal ROS (+)   Abdominal Normal abdominal exam  (+)   Peds negative pediatric ROS (+)  Hematology negative hematology ROS (+)   Anesthesia Other Findings   Reproductive/Obstetrics                             Anesthesia Physical Anesthesia Plan  ASA: I  Anesthesia Plan: General   Post-op Pain Management:    Induction: Intravenous  PONV Risk Score and Plan: 1 and Ondansetron  Airway Management Planned: Natural Airway and Nasal Cannula  Additional Equipment:   Intra-op Plan:   Post-operative Plan:   Informed Consent: I have reviewed the patients History and Physical, chart, labs and discussed the procedure including the risks, benefits and alternatives for the proposed anesthesia with the patient or authorized representative who has indicated his/her understanding and acceptance.     Plan Discussed with: Surgeon  Anesthesia Plan Comments:         Anesthesia Quick Evaluation

## 2016-12-24 NOTE — H&P (Signed)
Donna Larson [id number] Dec 14, 1955     HPI: Healthy 61 y/o for screening colonoscopy. Had reported some diarrhea earlier this summer. Tolerated prep well.     Prescriptions Prior to Admission  Medication Sig Dispense Refill Last Dose  . Calcium Carbonate-Vitamin D (CALCIUM-VITAMIN D) 600-200 MG-UNIT CAPS Take 1 capsule by mouth 3 (three) times daily.    Past Week at Unknown time  . cromolyn (NASALCROM) 5.2 MG/ACT nasal spray Place 1 spray into both nostrils 3 (three) times daily.   Past Week at Unknown time  . FLUoxetine (PROZAC) 20 MG tablet Take 20 mg by mouth daily.     12/23/2016 at Unknown time  . Nutritional Supplements (ESTROVEN PO) Take 1 tablet by mouth daily.     Past Week at Unknown time  . polyethylene glycol powder (GLYCOLAX/MIRALAX) powder 255 grams one bottle for colonoscopy prep 255 g 0 12/23/2016 at Unknown time  . YUVAFEM 10 MCG TABS vaginal tablet Place 1 suppository vaginally 2 (two) times a week.  11 Past Week at Unknown time   Allergies  Allergen Reactions  . Penicillins     REACTION: reaction not known  . Shellfish Allergy Nausea And Vomiting  . Sulfonamide Derivatives     REACTION: reaction not known   Past Medical History:  Diagnosis Date  . Allergy    seasonal  . Hyperlipidemia   . OCD (obsessive compulsive disorder)    Past Surgical History:  Procedure Laterality Date  . ABDOMINAL HYSTERECTOMY  2000   Partial, fibroids  . BREAST BIOPSY Left 2006   Negative  . BREAST BIOPSY Left 2015   Negative  . BREAST SURGERY Left 2006  . COLONOSCOPY  12/2006  . DOPPLER ECHOCARDIOGRAPHY  1997   Equivocal  . LIPOSUCTION  2010  . TUBAL LIGATION  1992   Social History   Social History  . Marital status: Married    Spouse name: N/A  . Number of children: 0  . Years of education: N/A   Occupational History  . Social work [employer]   Social History Main Topics  . Smoking status: Never Smoker  . Smokeless tobacco: Never Used  . Alcohol use 0.0  oz/week     Comment: daily-wine/beer   . Drug use: No  . Sexual activity: Not on file   Other Topics Concern  . Not on file   Social History Narrative   Regular exercise:  Walking and calesthenics   Occupation: Sports administratorathologist for [employer]    Social History   Social History Narrative   Regular exercise:  Walking and calesthenics   Occupation: Sports administratorathologist for [employer]      ROS: Negative.     PE: HEENT: Negative. Lungs: Clear. Cardio: RR.   Assessment/Plan:  Proceed with planned endoscopy.   Donna Larson, Maguire Killmer W 12/24/2016

## 2016-12-24 NOTE — Transfer of Care (Signed)
Immediate Anesthesia Transfer of Care Note  [facility]  Procedure(s) Performed: COLONOSCOPY WITH PROPOFOL (canceled)  Patient Location: PACU  Anesthesia Type:General  Level of Consciousness: awake  Airway & Oxygen Therapy: Patient Spontanous Breathing and Patient connected to nasal cannula oxygen  Post-op Assessment: Report given to RN and Post -op Vital signs reviewed and stable  Post vital signs: Reviewed  Last Vitals:  Vitals:   12/24/16 0650  BP: 120/69  Pulse: 81  Resp: 16  Temp: (!) 35.8 C  SpO2: 98%    Last Pain:  Vitals:   12/24/16 0650  TempSrc: Tympanic         Complications: No apparent anesthesia complications

## 2016-12-24 NOTE — Anesthesia Post-op Follow-up Note (Signed)
Anesthesia QCDR form completed.        

## 2016-12-26 ENCOUNTER — Encounter: Payer: Self-pay | Admitting: General Surgery

## 2017-02-18 ENCOUNTER — Ambulatory Visit (INDEPENDENT_AMBULATORY_CARE_PROVIDER_SITE_OTHER): Payer: BLUE CROSS/BLUE SHIELD | Admitting: General Surgery

## 2017-02-18 ENCOUNTER — Encounter: Payer: Self-pay | Admitting: General Surgery

## 2017-02-18 VITALS — BP 128/72 | HR 72 | Resp 12 | Ht 67.0 in | Wt 134.0 lb

## 2017-02-18 DIAGNOSIS — S01112A Laceration without foreign body of left eyelid and periocular area, initial encounter: Secondary | ICD-10-CM

## 2017-02-18 NOTE — Progress Notes (Signed)
Patient ID: Donna Larson, female   DOB: Aug 07, 1955, 61 y.o.   MRN: [id number]  Chief Complaint  Patient presents with  . Other    HPI Donna Larson is a 61 y.o. female here today for a evaluation of a fall and open wound on her left temple. Patient states she had a fall this morning after she tripped on something. No associated symptoms such as LOC, dizziness, visual changes.She noticed a cut lateral to left eye and a bruise below left patella. HPI  Past Medical History:  Diagnosis Date  . Allergy    seasonal  . Hyperlipidemia   . OCD (obsessive compulsive disorder)     Past Surgical History:  Procedure Laterality Date  . ABDOMINAL HYSTERECTOMY  2000   Partial, fibroids  . BREAST BIOPSY Left 2006   Negative  . BREAST BIOPSY Left 2015   Negative  . BREAST SURGERY Left 2006  . COLONOSCOPY  12/2006  . COLONOSCOPY WITH PROPOFOL N/A 12/24/2016   Procedure: COLONOSCOPY WITH PROPOFOL;  Surgeon: Earline MayotteByrnett, Jeffrey W, MD;  Location: [facility] ENDOSCOPY;  Service: Endoscopy;  Laterality: N/A;  . DOPPLER ECHOCARDIOGRAPHY  1997   Equivocal  . LIPOSUCTION  2010  . TUBAL LIGATION  1992    Family History  Problem Relation Age of Onset  . Arthritis Mother        osteoarthritis  . Depression Mother   . Hypertension Mother   . Osteoporosis Mother   . Hyperlipidemia Father   . Heart disease Father        Bicuspid aortic valve - replaced; sick sinus syndrome  . Heart disease Other        Heart valve repair  . Cancer Other        1 had Breast CA ; 2 had colon CA  . Heart disease Paternal Grandfather        MI  . Cancer Paternal Grandfather        Renal  . Breast cancer Neg Hx     Social History Social History   Tobacco Use  . Smoking status: Never Smoker  . Smokeless tobacco: Never Used  Substance Use Topics  . Alcohol use: Yes    Alcohol/week: 0.0 oz    Comment: daily-wine/beer   . Drug use: No    Allergies  Allergen Reactions  . Penicillins     REACTION: reaction  not known  . Shellfish Allergy Nausea And Vomiting  . Sulfonamide Derivatives     REACTION: reaction not known    Current Outpatient Medications  Medication Sig Dispense Refill  . Calcium Carbonate-Vitamin D (CALCIUM-VITAMIN D) 600-200 MG-UNIT CAPS Take 1 capsule by mouth 3 (three) times daily.     Marland Kitchen. FLUoxetine (PROZAC) 20 MG tablet Take 20 mg by mouth daily.      . Nutritional Supplements (ESTROVEN PO) Take 1 tablet by mouth daily.      Anson Fret. YUVAFEM 10 MCG TABS vaginal tablet Place 1 suppository vaginally 2 (two) times a week.  11   No current facility-administered medications for this visit.     Review of Systems Review of Systems  Constitutional: Negative.   Respiratory: Negative.   Cardiovascular: Negative.     Blood pressure 128/72, pulse 72, resp. rate 12, height 5\' 7"  (1.702 m), weight 134 lb (60.8 kg).  Physical Exam Physical Exam  Constitutional: She is oriented to person, place, and time. She appears well-developed and well-nourished.  Neurological: She is alert and oriented to person, place, and time.  Skin: Skin is warm and dry.       Data Reviewed None  Assessment  1 cm clean laceration lateral to the left eye Superficial bruising just below the left patella       Plan    The superficial bruise and abrasion was covered with Neosporin and a large Band-Aid. Since the 1 cm cut lateral to the eye was well approximated and clean it was covered with Dermabond. Patient advised further wound care and to call for any new symptoms that may arise.   Return as needed.   HPI, Physical Exam, Assessment and Plan have been scribed under the direction and in the presence of Kathreen CosierS. G. Zyiere Rosemond, MD  Ples SpecterJessica Qualls, CMA I have completed the exam and reviewed the above documentation for accuracy and completeness.  I agree with the above.  Museum/gallery conservatorDragon Technology has been used and any errors in dictation or transcription are unintentional.  Aeliana Spates G. Evette CristalSankar, M.D.,  F.A.C.S.  Gerlene BurdockSANKAR,Kyrese Gartman G 02/19/2017, 8:24 AM

## 2017-02-18 NOTE — Patient Instructions (Signed)
Return as needed

## 2017-03-17 DIAGNOSIS — Z01419 Encounter for gynecological examination (general) (routine) without abnormal findings: Secondary | ICD-10-CM | POA: Diagnosis not present

## 2017-03-17 DIAGNOSIS — Z6821 Body mass index (BMI) 21.0-21.9, adult: Secondary | ICD-10-CM | POA: Diagnosis not present

## 2017-04-16 DIAGNOSIS — F429 Obsessive-compulsive disorder, unspecified: Secondary | ICD-10-CM | POA: Diagnosis not present

## 2017-07-02 DIAGNOSIS — D1801 Hemangioma of skin and subcutaneous tissue: Secondary | ICD-10-CM | POA: Diagnosis not present

## 2017-07-02 DIAGNOSIS — L814 Other melanin hyperpigmentation: Secondary | ICD-10-CM | POA: Diagnosis not present

## 2017-07-08 ENCOUNTER — Telehealth (INDEPENDENT_AMBULATORY_CARE_PROVIDER_SITE_OTHER): Payer: BLUE CROSS/BLUE SHIELD | Admitting: Family Medicine

## 2017-07-08 ENCOUNTER — Other Ambulatory Visit: Payer: BLUE CROSS/BLUE SHIELD

## 2017-07-08 DIAGNOSIS — Z0000 Encounter for general adult medical examination without abnormal findings: Secondary | ICD-10-CM | POA: Diagnosis not present

## 2017-07-08 LAB — LIPID PANEL
Cholesterol: 199 mg/dL (ref 0–200)
HDL: 80.5 mg/dL (ref >39.00)
LDL Cholesterol: 105 mg/dL — ABNORMAL HIGH (ref 0–99)
NONHDL: 118.3
TRIGLYCERIDES: 68 mg/dL (ref 0.0–149.0)
Total CHOL/HDL Ratio: 2
VLDL: 13.6 mg/dL (ref 0.0–40.0)

## 2017-07-08 LAB — COMPREHENSIVE METABOLIC PANEL
ALBUMIN: 4.1 g/dL (ref 3.5–5.2)
ALT: 15 U/L (ref 0–35)
AST: 18 U/L (ref 0–37)
Alkaline Phosphatase: 60 U/L (ref 39–117)
BUN: 22 mg/dL (ref 6–23)
CHLORIDE: 104 meq/L (ref 96–112)
CO2: 30 mEq/L (ref 19–32)
Calcium: 9.5 mg/dL (ref 8.4–10.5)
Creatinine, Ser: 0.86 mg/dL (ref 0.40–1.20)
GFR: 71.08 mL/min (ref >60.00)
GLUCOSE: 102 mg/dL — AB (ref 70–99)
POTASSIUM: 4.8 meq/L (ref 3.5–5.1)
SODIUM: 141 meq/L (ref 135–145)
Total Bilirubin: 0.3 mg/dL (ref 0.2–1.2)
Total Protein: 6.4 g/dL (ref 6.0–8.3)

## 2017-07-08 LAB — CBC WITH DIFFERENTIAL/PLATELET
BASOS PCT: 0.6 % (ref 0.0–3.0)
Basophils Absolute: 0 10*3/uL (ref 0.0–0.1)
EOS PCT: 4.1 % (ref 0.0–5.0)
Eosinophils Absolute: 0.2 10*3/uL (ref 0.0–0.7)
HEMATOCRIT: 42.3 % (ref 36.0–46.0)
HEMOGLOBIN: 14.4 g/dL (ref 12.0–15.0)
Lymphocytes Relative: 34.5 % (ref 12.0–46.0)
Lymphs Abs: 2.1 10*3/uL (ref 0.7–4.0)
MCHC: 33.9 g/dL (ref 30.0–36.0)
MCV: 93.4 fl (ref 78.0–100.0)
MONO ABS: 0.5 10*3/uL (ref 0.1–1.0)
MONOS PCT: 8 % (ref 3.0–12.0)
Neutro Abs: 3.2 10*3/uL (ref 1.4–7.7)
Neutrophils Relative %: 52.8 % (ref 43.0–77.0)
Platelets: 276 10*3/uL (ref 150.0–400.0)
RBC: 4.54 Mil/uL (ref 3.87–5.11)
RDW: 12.2 % (ref 11.5–15.5)
WBC: 6 10*3/uL (ref 4.0–10.5)

## 2017-07-08 LAB — TSH: TSH: 2.65 u[IU]/mL (ref 0.35–4.50)

## 2017-07-08 NOTE — Telephone Encounter (Signed)
-----   Message from Alvina Chou sent at 07/08/2017  7:51 AM EDT ----- Regarding: lab orders for now Patient is scheduled for CPX labs, please order future labs, Thanks , Camelia Eng

## 2017-07-13 ENCOUNTER — Ambulatory Visit (INDEPENDENT_AMBULATORY_CARE_PROVIDER_SITE_OTHER): Payer: BLUE CROSS/BLUE SHIELD | Admitting: Family Medicine

## 2017-07-13 ENCOUNTER — Encounter: Payer: Self-pay | Admitting: Family Medicine

## 2017-07-13 VITALS — BP 92/60 | HR 77 | Temp 98.1°F | Ht 66.5 in | Wt 134.5 lb

## 2017-07-13 DIAGNOSIS — F429 Obsessive-compulsive disorder, unspecified: Secondary | ICD-10-CM

## 2017-07-13 DIAGNOSIS — Z0000 Encounter for general adult medical examination without abnormal findings: Secondary | ICD-10-CM

## 2017-07-13 NOTE — Progress Notes (Signed)
Subjective:    Patient ID: Donna Larson, female    DOB: 08-28-1955, 62 y.o.   MRN: [id number]  HPI Here for health maintenance exam and to review chronic medical problems    Work is tough  Understaffed / a lot of people out right now  Getting someone new - should be better after June   She will have vacation- going to [place]   She fell in December (tripped over a cone not paying attention) and hit her head with glasses -had to see a Engineer, petroleum  No broken bones   Wt Readings from Last 3 Encounters:  07/13/17 134 lb 8 oz (61 kg)  02/18/17 134 lb (60.8 kg)  12/24/16 136 lb (61.7 kg)  wt looks good - she is eating enough  Trying to maintain  Walking in the ams and drinks lots of water  (and treadmill if weather is bad)  21.38 kg/m   Mammogram 5/18 - has not set that up yet - will set that up herself at [facility]  Self breast exam -no lumps   Flu shot 10/18  Tetanus shot 12/13  Colonoscopy 10/18 -normal , 10 year recall   dexa 1/12 - BMD in nl range  Mother had OP  No fractures (one fall)    Hx of past hysterectomy  Sees gyn  yuafem vaginal tablet  Had a breast exam in January    OCD- takes fluoxetine  Still helps a lot- wants to stay on it  Keeping up with counseling sessions as well    Cholesterol  Lab Results  Component Value Date   CHOL 199 07/08/2017   CHOL 202 (H) 07/03/2016   CHOL 207 (H) 06/21/2015   Lab Results  Component Value Date   HDL 80.50 07/08/2017   HDL 86.20 07/03/2016   HDL 83.40 06/21/2015   Lab Results  Component Value Date   LDLCALC 105 (H) 07/08/2017   LDLCALC 104 (H) 07/03/2016   LDLCALC 109 (H) 06/21/2015   Lab Results  Component Value Date   TRIG 68.0 07/08/2017   TRIG 59.0 07/03/2016   TRIG 71.0 06/21/2015   Lab Results  Component Value Date   CHOLHDL 2 07/08/2017   CHOLHDL 2 07/03/2016   CHOLHDL 2 06/21/2015   Lab Results  Component Value Date   LDLDIRECT 109.0 10/30/2008    Other labs Results for  orders placed or performed in visit on 07/08/17  CBC with Differential/Platelet  Result Value Ref Range   WBC 6.0 4.0 - 10.5 K/uL   RBC 4.54 3.87 - 5.11 Mil/uL   Hemoglobin 14.4 12.0 - 15.0 g/dL   HCT 95.6 21.3 - 08.6 %   MCV 93.4 78.0 - 100.0 fl   MCHC 33.9 30.0 - 36.0 g/dL   RDW 57.8 46.9 - 62.9 %   Platelets 276.0 150.0 - 400.0 K/uL   Neutrophils Relative % 52.8 43.0 - 77.0 %   Lymphocytes Relative 34.5 12.0 - 46.0 %   Monocytes Relative 8.0 3.0 - 12.0 %   Eosinophils Relative 4.1 0.0 - 5.0 %   Basophils Relative 0.6 0.0 - 3.0 %   Neutro Abs 3.2 1.4 - 7.7 K/uL   Lymphs Abs 2.1 0.7 - 4.0 K/uL   Monocytes Absolute 0.5 0.1 - 1.0 K/uL   Eosinophils Absolute 0.2 0.0 - 0.7 K/uL   Basophils Absolute 0.0 0.0 - 0.1 K/uL  Comprehensive metabolic panel  Result Value Ref Range   Sodium 141 135 - 145 mEq/L  Potassium 4.8 3.5 - 5.1 mEq/L   Chloride 104 96 - 112 mEq/L   CO2 30 19 - 32 mEq/L   Glucose, Bld 102 (H) 70 - 99 mg/dL   BUN 22 6 - 23 mg/dL   Creatinine, Ser 1.61 0.40 - 1.20 mg/dL   Total Bilirubin 0.3 0.2 - 1.2 mg/dL   Alkaline Phosphatase 60 39 - 117 U/L   AST 18 0 - 37 U/L   ALT 15 0 - 35 U/L   Total Protein 6.4 6.0 - 8.3 g/dL   Albumin 4.1 3.5 - 5.2 g/dL   Calcium 9.5 8.4 - 09.6 mg/dL   GFR 04.54 >09.81 mL/min  Lipid panel  Result Value Ref Range   Cholesterol 199 0 - 200 mg/dL   Triglycerides 19.1 0.0 - 149.0 mg/dL   HDL 47.82 >95.62 mg/dL   VLDL 13.0 0.0 - 86.5 mg/dL   LDL Cholesterol 784 (H) 0 - 99 mg/dL   Total CHOL/HDL Ratio 2    NonHDL 118.30   TSH  Result Value Ref Range   TSH 2.65 0.35 - 4.50 uIU/mL    Patient Active Problem List   Diagnosis Date Noted  . Breast microcalcification, mammographic 05/14/2013  . Encounter for screening mammogram for breast cancer 02/17/2011  . Routine general medical examination at a health care facility 12/05/2010  . Obsessive-compulsive disorder 10/09/2006  . ALLERGIC RHINITIS 10/09/2006  . DERMATITIS, CONTACT, NOS  10/09/2006   Past Medical History:  Diagnosis Date  . Allergy    seasonal  . Hyperlipidemia   . OCD (obsessive compulsive disorder)    Past Surgical History:  Procedure Laterality Date  . ABDOMINAL HYSTERECTOMY  2000   Partial, fibroids  . BREAST BIOPSY Left 2006   Negative  . BREAST BIOPSY Left 2015   Negative  . BREAST SURGERY Left 2006  . COLONOSCOPY  12/2006  . COLONOSCOPY WITH PROPOFOL N/A 12/24/2016   Procedure: COLONOSCOPY WITH PROPOFOL;  Surgeon: [name], MD;  Location: [facility] ENDOSCOPY;  Service: Endoscopy;  Laterality: N/A;  . DOPPLER ECHOCARDIOGRAPHY  1997   Equivocal  . LIPOSUCTION  2010  . TUBAL LIGATION  1992   Social History   Tobacco Use  . Smoking status: Never Smoker  . Smokeless tobacco: Never Used  Substance Use Topics  . Alcohol use: Yes    Alcohol/week: 0.0 oz    Comment: daily-wine/beer   . Drug use: No   Family History  Problem Relation Age of Onset  . Arthritis Mother        osteoarthritis  . Depression Mother   . Hypertension Mother   . Osteoporosis Mother   . Hyperlipidemia Father   . Heart disease Father        Bicuspid aortic valve - replaced; sick sinus syndrome  . Heart disease Other        Heart valve repair  . Cancer Other        1 had Breast CA ; 2 had colon CA  . Heart disease Paternal Grandfather        MI  . Cancer Paternal Grandfather        Renal  . Breast cancer Neg Hx    Allergies  Allergen Reactions  . Penicillins     REACTION: reaction not known  . Shellfish Allergy Nausea And Vomiting  . Sulfonamide Derivatives     REACTION: reaction not known   Current Outpatient Medications on File Prior to Visit  Medication Sig Dispense Refill  . Calcium  Carbonate-Vitamin D (CALCIUM-VITAMIN D) 600-200 MG-UNIT CAPS Take 1 capsule by mouth 3 (three) times daily.     Marland Kitchen FLUoxetine (PROZAC) 20 MG tablet Take 20 mg by mouth daily.      . Nutritional Supplements (ESTROVEN PO) Take 1 tablet by mouth daily.      Anson Fret 10 MCG TABS vaginal tablet Place 2 suppositories vaginally 2 (two) times a week.   11   No current facility-administered medications on file prior to visit.     Review of Systems  Constitutional: Negative for activity change, appetite change, fatigue, fever and unexpected weight change.  HENT: Negative for congestion, ear pain, rhinorrhea, sinus pressure and sore throat.   Eyes: Negative for pain, redness and visual disturbance.  Respiratory: Negative for cough, shortness of breath and wheezing.   Cardiovascular: Negative for chest pain and palpitations.  Gastrointestinal: Negative for abdominal pain, blood in stool, constipation and diarrhea.  Endocrine: Negative for polydipsia and polyuria.  Genitourinary: Negative for dysuria, frequency and urgency.  Musculoskeletal: Negative for arthralgias, back pain and myalgias.  Skin: Negative for pallor and rash.  Allergic/Immunologic: Negative for environmental allergies.  Neurological: Negative for dizziness, syncope and headaches.  Hematological: Negative for adenopathy. Does not bruise/bleed easily.  Psychiatric/Behavioral: Negative for decreased concentration and dysphoric mood. The patient is not nervous/anxious.        Stressors with stress reaction        Objective:   Physical Exam  Constitutional: She appears well-developed and well-nourished. No distress.  Well appearing   HENT:  Head: Normocephalic and atraumatic.  Right Ear: External ear normal.  Left Ear: External ear normal.  Nose: Nose normal.  Mouth/Throat: Oropharynx is clear and moist.  Eyes: Pupils are equal, round, and reactive to light. Conjunctivae and EOM are normal. Right eye exhibits no discharge. Left eye exhibits no discharge. No scleral icterus.  Neck: Normal range of motion. Neck supple. No JVD present. Carotid bruit is not present. No thyromegaly present.  Cardiovascular: Normal rate, regular rhythm, normal heart sounds and intact distal pulses. Exam  reveals no gallop.  Pulmonary/Chest: Effort normal and breath sounds normal. No respiratory distress. She has no wheezes. She has no rales.  Abdominal: Soft. Bowel sounds are normal. She exhibits no distension and no mass. There is no tenderness.  Musculoskeletal: She exhibits no edema or tenderness.  Lymphadenopathy:    She has no cervical adenopathy.  Neurological: She is alert. She has normal reflexes. No cranial nerve deficit. She exhibits normal muscle tone. Coordination normal.  Skin: Skin is warm and dry. No rash noted. No erythema. No pallor.  Scattered lentigines Fair complexion  Few abrasions on feet-healing   Psychiatric: She has a normal mood and affect.  Pleasant           Assessment & Plan:   Problem List Items Addressed This Visit      Other   Obsessive-compulsive disorder    Doing well with fluoxetine Continues counseling Reviewed stressors/ coping techniques/symptoms/ support sources/ tx options and side effects in detail today       Routine general medical examination at a health care facility - Primary    Reviewed health habits including diet and exercise and skin cancer prevention Reviewed appropriate screening tests for age  Also reviewed health mt list, fam hx and immunization status , as well as social and family history   See HPI Labs rev  utd gyn care  She will schedule her own mammogram  Enc her to  keep exercising

## 2017-07-13 NOTE — Assessment & Plan Note (Signed)
Reviewed health habits including diet and exercise and skin cancer prevention Reviewed appropriate screening tests for age  Also reviewed health mt list, fam hx and immunization status , as well as social and family history   See HPI Labs rev  utd gyn care  She will schedule her own mammogram  Enc her to keep exercising

## 2017-07-13 NOTE — Patient Instructions (Addendum)
Don't forget to schedule your mammogram   Take care of yourself  Keep walking  Be careful not to fall   Have a good vacation   Continue eating a healthy diet

## 2017-07-13 NOTE — Assessment & Plan Note (Signed)
Doing well with fluoxetine Continues counseling Reviewed stressors/ coping techniques/symptoms/ support sources/ tx options and side effects in detail today

## 2017-07-15 ENCOUNTER — Other Ambulatory Visit: Payer: BLUE CROSS/BLUE SHIELD

## 2017-09-07 ENCOUNTER — Other Ambulatory Visit: Payer: Self-pay | Admitting: Family Medicine

## 2017-09-07 DIAGNOSIS — Z1231 Encounter for screening mammogram for malignant neoplasm of breast: Secondary | ICD-10-CM

## 2017-09-11 IMAGING — MG MM DIGITAL SCREENING BILAT W/ TOMO W/ CAD
9 of 12 series · 9 of 28 positions shown · non-contrast
Comparison: Previous exam(s).

CLINICAL DATA: Screening.

EXAM:
2D DIGITAL SCREENING BILATERAL MAMMOGRAM WITH CAD AND ADJUNCT TOMO

[L MLO synth-2D]
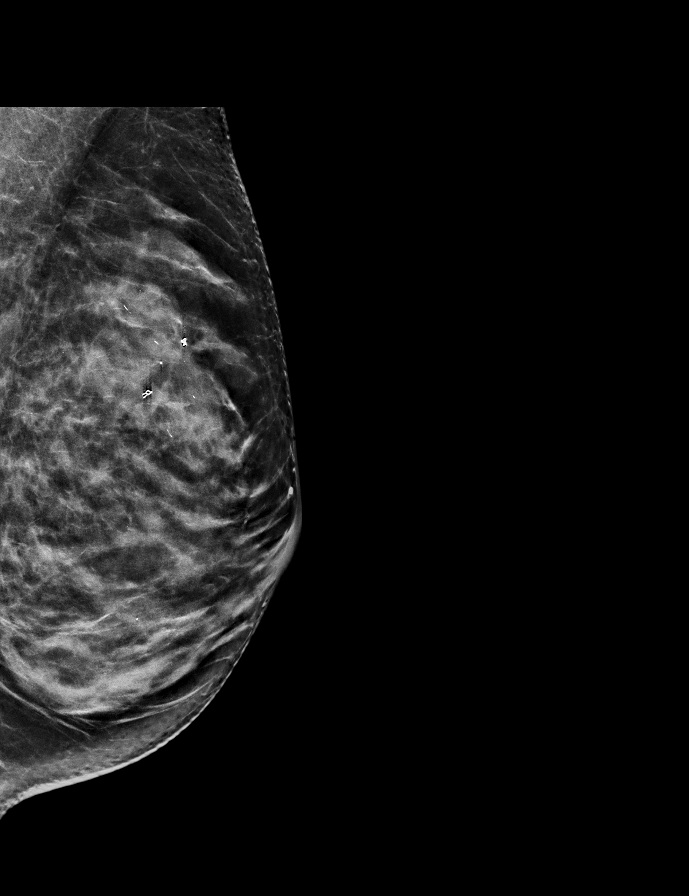

[L MLO]
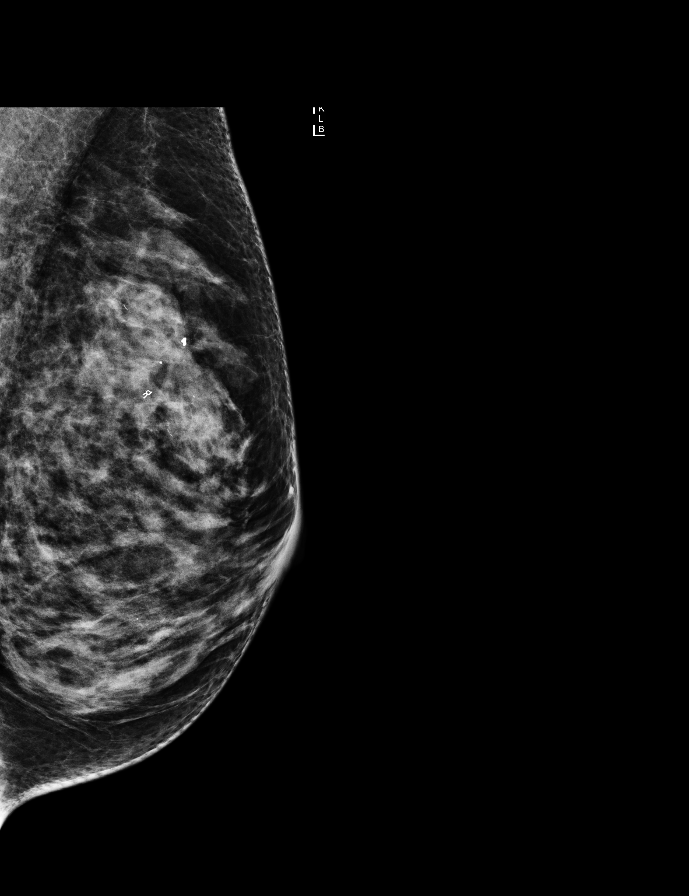

[R CC synth-2D]
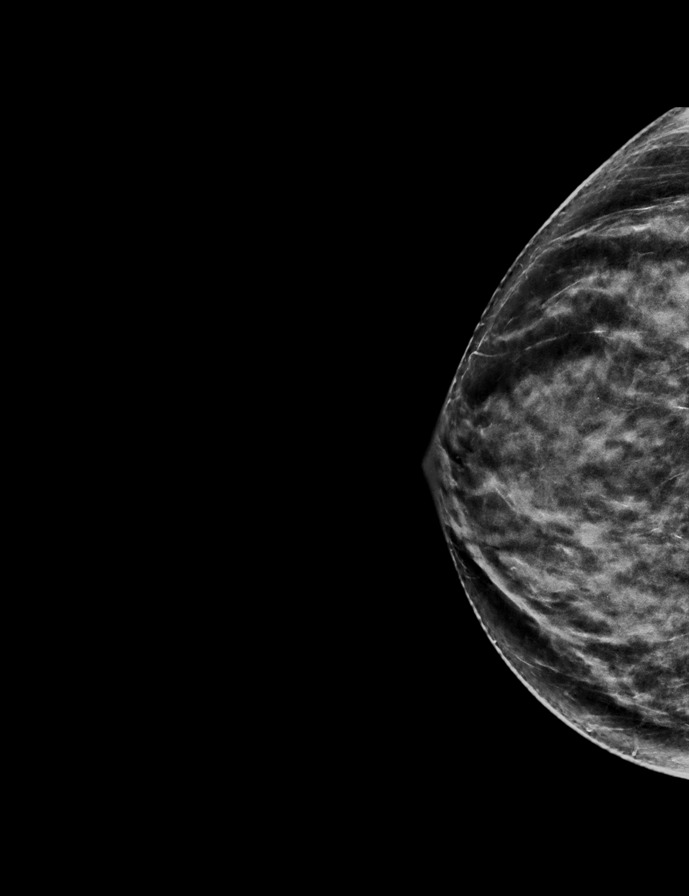

[L CC synth-2D]
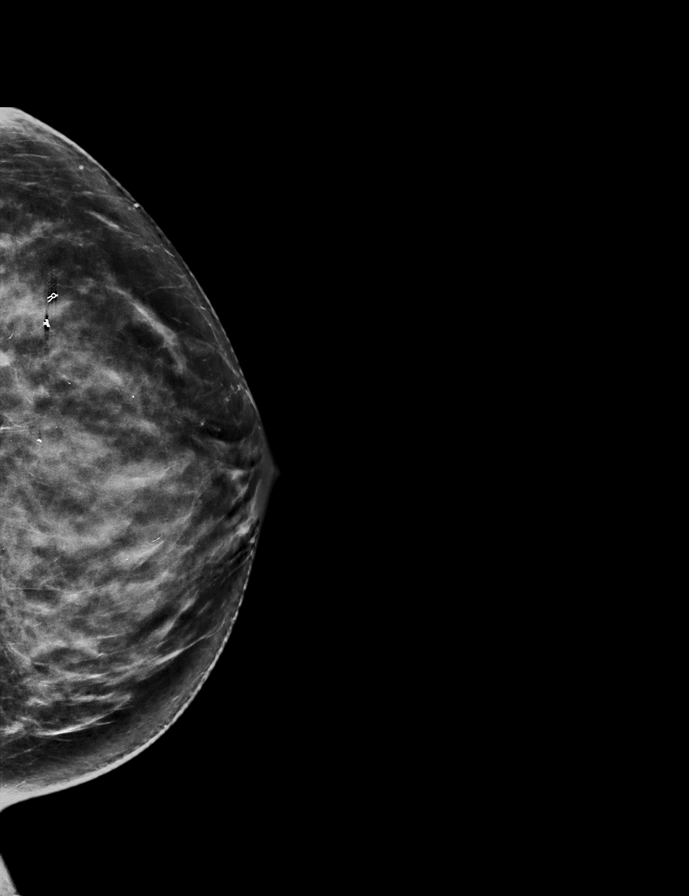

[R MLO synth-2D]
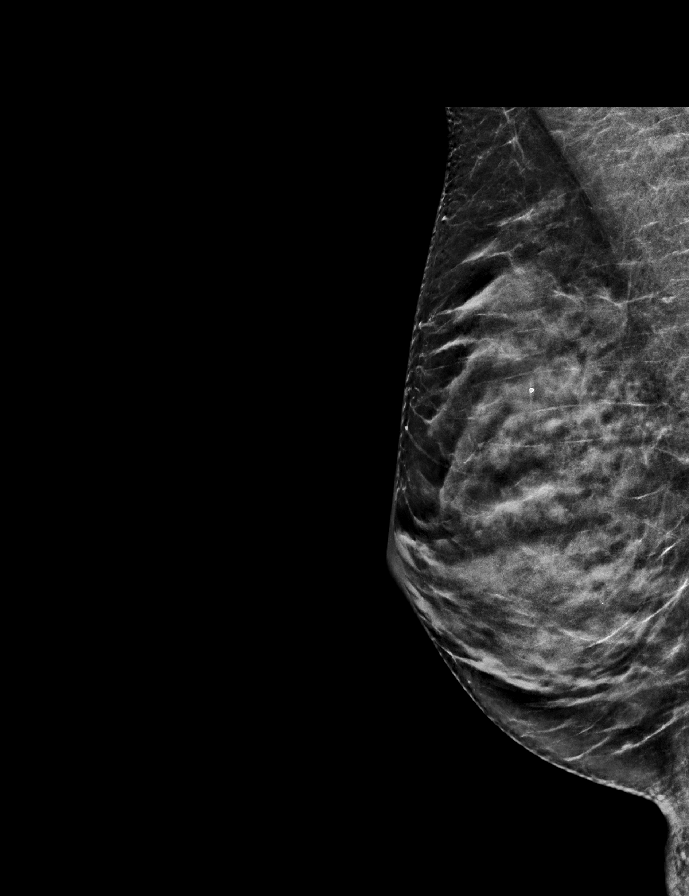

[L CC]
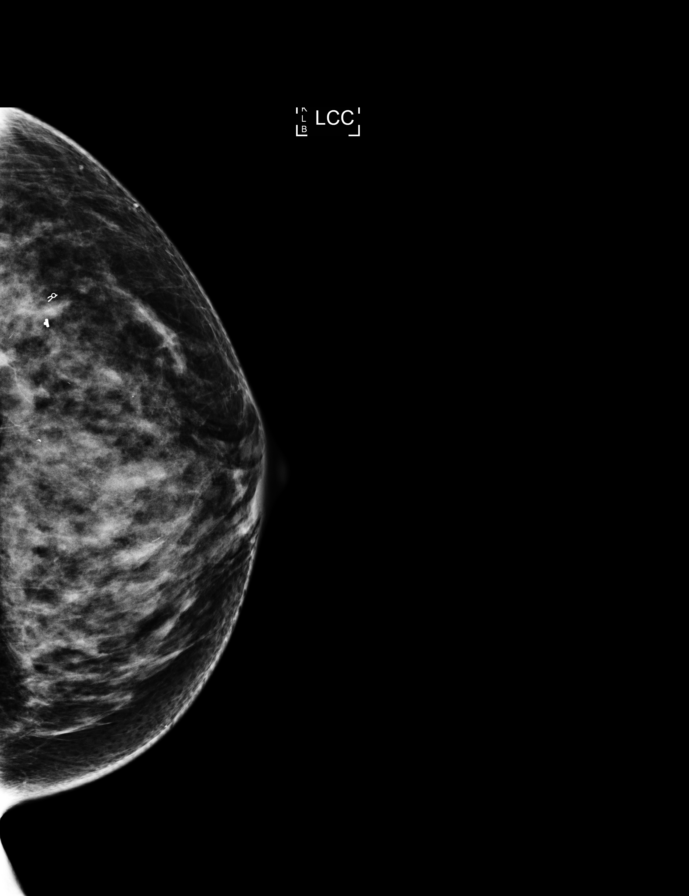

[R MLO]
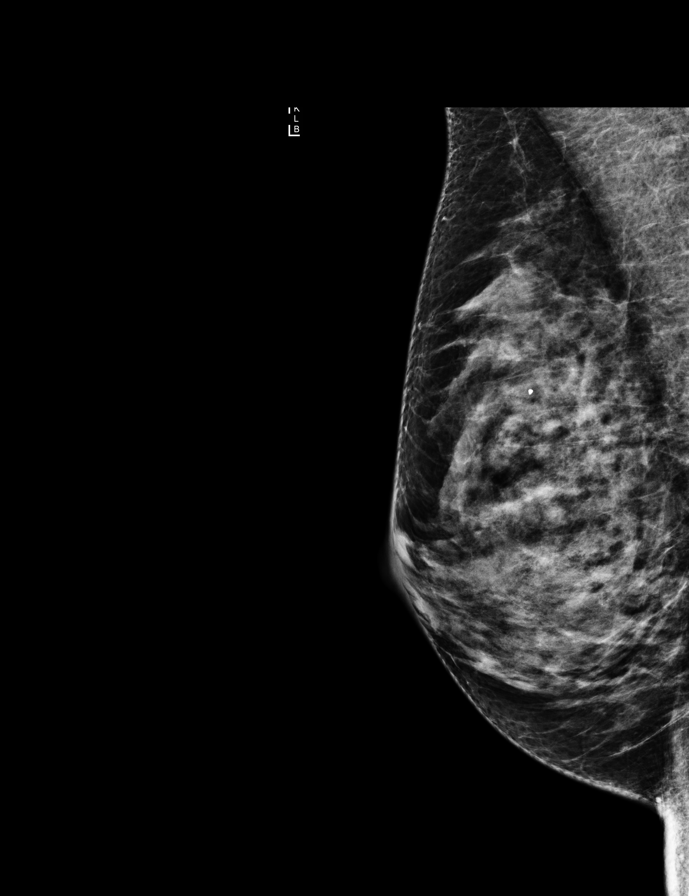

[R CC]
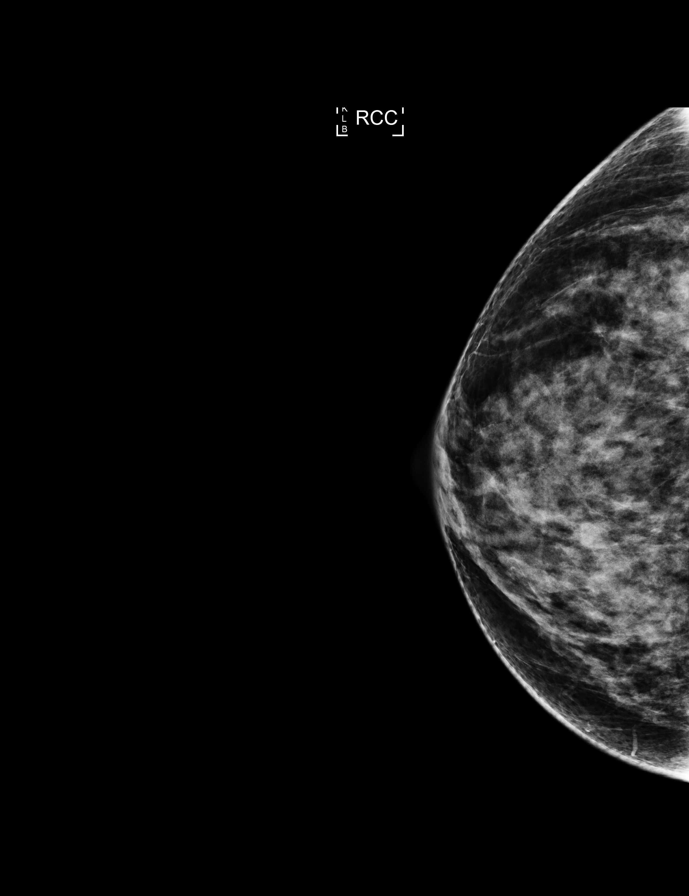

[R CC tomo · tomo slice 31/61.0]
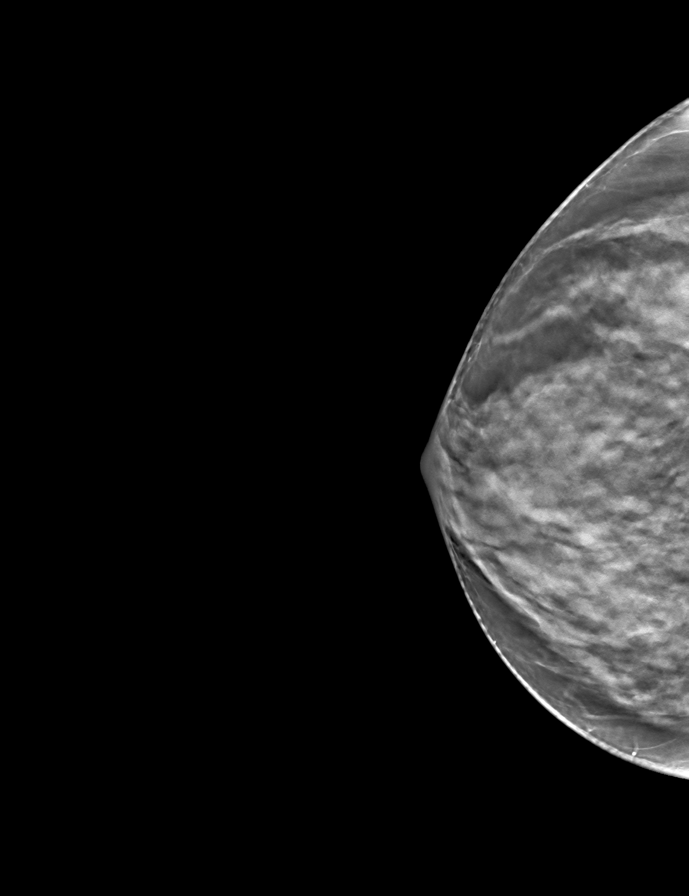

[9 of 28 positions shown; findings below may reference images not displayed]

ACR Breast Density Category c: The breast tissue is heterogeneously
dense, which may obscure small masses.
FINDINGS: There are no findings suspicious for malignancy. Images were
processed with CAD.
IMPRESSION: No mammographic evidence of malignancy. A result letter of this
screening mammogram will be mailed directly to the patient.

RECOMMENDATION:
Screening mammogram in one year. (Code:[id number])

BI-RADS CATEGORY  1: Negative.

## 2017-09-24 ENCOUNTER — Ambulatory Visit
Admission: RE | Admit: 2017-09-24 | Discharge: 2017-09-24 | Disposition: A | Discharge status: Home / Self Care | Payer: BLUE CROSS/BLUE SHIELD | Source: Ambulatory Visit | Attending: Family Medicine | Admitting: Family Medicine

## 2017-09-24 DIAGNOSIS — Z1231 Encounter for screening mammogram for malignant neoplasm of breast: Secondary | ICD-10-CM | POA: Diagnosis not present

## 2017-10-13 DIAGNOSIS — F429 Obsessive-compulsive disorder, unspecified: Secondary | ICD-10-CM | POA: Diagnosis not present

## 2017-11-11 ENCOUNTER — Encounter: Payer: Self-pay | Admitting: Family Medicine

## 2017-11-12 NOTE — Telephone Encounter (Signed)
I checked in the state database and pt had 2 Hep A on 04/23/01, and 11/23/01, a Polio on 11/29/2002, and Typhoid on 04/23/2001, and that was all that was in there

## 2017-11-18 ENCOUNTER — Encounter: Payer: Self-pay | Admitting: Family Medicine

## 2017-12-25 DIAGNOSIS — H524 Presbyopia: Secondary | ICD-10-CM | POA: Diagnosis not present

## 2018-03-19 DIAGNOSIS — Z6821 Body mass index (BMI) 21.0-21.9, adult: Secondary | ICD-10-CM | POA: Diagnosis not present

## 2018-03-19 DIAGNOSIS — N952 Postmenopausal atrophic vaginitis: Secondary | ICD-10-CM | POA: Diagnosis not present

## 2018-03-19 DIAGNOSIS — Z01411 Encounter for gynecological examination (general) (routine) with abnormal findings: Secondary | ICD-10-CM | POA: Diagnosis not present

## 2018-04-14 DIAGNOSIS — F429 Obsessive-compulsive disorder, unspecified: Secondary | ICD-10-CM | POA: Diagnosis not present

## 2018-04-21 DIAGNOSIS — F429 Obsessive-compulsive disorder, unspecified: Secondary | ICD-10-CM | POA: Diagnosis not present

## 2018-05-03 DIAGNOSIS — F429 Obsessive-compulsive disorder, unspecified: Secondary | ICD-10-CM | POA: Diagnosis not present

## 2018-06-07 DIAGNOSIS — F429 Obsessive-compulsive disorder, unspecified: Secondary | ICD-10-CM | POA: Diagnosis not present

## 2018-07-19 ENCOUNTER — Telehealth: Payer: Self-pay | Admitting: Family Medicine

## 2018-07-19 DIAGNOSIS — Z0000 Encounter for general adult medical examination without abnormal findings: Secondary | ICD-10-CM

## 2018-07-19 NOTE — Telephone Encounter (Signed)
-----   Message from Alvina Chou sent at 07/19/2018 10:34 AM EDT ----- Regarding: Lab orders for Tuesday, 5.19.20 Lab orders, thanks

## 2018-07-20 ENCOUNTER — Other Ambulatory Visit: Payer: Self-pay

## 2018-07-20 ENCOUNTER — Other Ambulatory Visit (INDEPENDENT_AMBULATORY_CARE_PROVIDER_SITE_OTHER): Payer: BLUE CROSS/BLUE SHIELD

## 2018-07-20 ENCOUNTER — Other Ambulatory Visit: Payer: BLUE CROSS/BLUE SHIELD

## 2018-07-20 DIAGNOSIS — Z0000 Encounter for general adult medical examination without abnormal findings: Secondary | ICD-10-CM | POA: Diagnosis not present

## 2018-07-20 LAB — COMPREHENSIVE METABOLIC PANEL
ALT: 17 U/L (ref 0–35)
AST: 18 U/L (ref 0–37)
Albumin: 4 g/dL (ref 3.5–5.2)
Alkaline Phosphatase: 54 U/L (ref 39–117)
BUN: 20 mg/dL (ref 6–23)
CO2: 29 mEq/L (ref 19–32)
Calcium: 8.8 mg/dL (ref 8.4–10.5)
Chloride: 103 mEq/L (ref 96–112)
Creatinine, Ser: 0.86 mg/dL (ref 0.40–1.20)
GFR: 66.65 mL/min (ref >60.00)
Glucose, Bld: 113 mg/dL — ABNORMAL HIGH (ref 70–99)
Potassium: 4.5 mEq/L (ref 3.5–5.1)
Sodium: 137 mEq/L (ref 135–145)
Total Bilirubin: 0.4 mg/dL (ref 0.2–1.2)
Total Protein: 6.5 g/dL (ref 6.0–8.3)

## 2018-07-20 LAB — TSH: TSH: 3.07 u[IU]/mL (ref 0.35–4.50)

## 2018-07-20 LAB — CBC WITH DIFFERENTIAL/PLATELET
Basophils Absolute: 0.1 10*3/uL (ref 0.0–0.1)
Basophils Relative: 1.2 % (ref 0.0–3.0)
Eosinophils Absolute: 0.3 10*3/uL (ref 0.0–0.7)
Eosinophils Relative: 4.3 % (ref 0.0–5.0)
HCT: 42 % (ref 36.0–46.0)
Hemoglobin: 14.2 g/dL (ref 12.0–15.0)
Lymphocytes Relative: 39.4 % (ref 12.0–46.0)
Lymphs Abs: 2.5 10*3/uL (ref 0.7–4.0)
MCHC: 33.8 g/dL (ref 30.0–36.0)
MCV: 94.9 fl (ref 78.0–100.0)
Monocytes Absolute: 0.4 10*3/uL (ref 0.1–1.0)
Monocytes Relative: 5.5 % (ref 3.0–12.0)
Neutro Abs: 3.2 10*3/uL (ref 1.4–7.7)
Neutrophils Relative %: 49.6 % (ref 43.0–77.0)
Platelets: 247 10*3/uL (ref 150.0–400.0)
RBC: 4.43 Mil/uL (ref 3.87–5.11)
RDW: 12.7 % (ref 11.5–15.5)
WBC: 6.5 10*3/uL (ref 4.0–10.5)

## 2018-07-20 LAB — LIPID PANEL
Cholesterol: 203 mg/dL — ABNORMAL HIGH (ref 0–200)
HDL: 82.7 mg/dL (ref >39.00)
LDL Cholesterol: 110 mg/dL — ABNORMAL HIGH (ref 0–99)
NonHDL: 120.37
Total CHOL/HDL Ratio: 2
Triglycerides: 50 mg/dL (ref 0.0–149.0)
VLDL: 10 mg/dL (ref 0.0–40.0)

## 2018-07-27 ENCOUNTER — Ambulatory Visit (INDEPENDENT_AMBULATORY_CARE_PROVIDER_SITE_OTHER): Payer: BLUE CROSS/BLUE SHIELD | Admitting: Family Medicine

## 2018-07-27 ENCOUNTER — Encounter: Payer: Self-pay | Admitting: Family Medicine

## 2018-07-27 DIAGNOSIS — R7309 Other abnormal glucose: Secondary | ICD-10-CM

## 2018-07-27 DIAGNOSIS — Z0000 Encounter for general adult medical examination without abnormal findings: Secondary | ICD-10-CM

## 2018-07-27 DIAGNOSIS — F430 Acute stress reaction: Secondary | ICD-10-CM

## 2018-07-27 DIAGNOSIS — F429 Obsessive-compulsive disorder, unspecified: Secondary | ICD-10-CM

## 2018-07-27 NOTE — Progress Notes (Signed)
Virtual Visit via Video Note  I connected with Donna Larson on 07/27/18 at  8:30 AM EDT by a video enabled telemedicine application and verified that I am speaking with the correct person using two identifiers.  Location: Patient: home Provider: office    I discussed the limitations of evaluation and management by telemedicine and the availability of in person appointments. The patient expressed understanding and agreed to proceed.  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   (this was against her will overall) ,  Now making her go back again  Very high volumes for work as well  The system just does not work  (it is failing)  The pandemic also put a wrench into things   Thinks that her increased glucose level is from stress   She may need a medical letter to get out of her obligation to not go to the hospital (to cut back stress)    She has looked elsewhere (she is 13)  May have to stop working there  Trying to work with the team   She is reading some self help books Also seeing counselor   Feeling more anxious than depressed  Not hopeless Good motivation   Husband got a fib - thinks it was started by stress   Wt Readings from Last 3 Encounters:  07/13/17 134 lb 8 oz (61 kg)  02/18/17 134 lb (60.8 kg)  12/24/16 136 lb (61.7 kg)  has not gained any weight (clothes fit well)  She has been exercising and eating right 7 days per week  Drinking lots of water   Mammogram 7/19  Self breast exam - no lumps on self exam Sees gyn for exam - in January   Flu shot 9/19 Tetanus shot 12/13  Colonoscopy 10/18 -nl with 10 y recall   OCD Takes fluoxetine 20 mg daily  Mostly anxious  Does not want to go up on dose   From gyn  uses est vag suppository twice weekly  Has had a hysterectomy   dexa 1/12 - BMD in the normal range  Cholesterol  Lab Results  Component Value Date   CHOL 203 (H) 07/20/2018   CHOL 199 07/08/2017   CHOL 202 (H) 07/03/2016   Lab Results  Component Value Date   HDL 82.70 07/20/2018   HDL 80.50 07/08/2017   HDL 86.20 07/03/2016   Lab Results  Component Value Date   LDLCALC 110 (H) 07/20/2018   LDLCALC 105 (H) 07/08/2017   LDLCALC 104 (H) 07/03/2016   Lab Results  Component Value Date   TRIG 50.0 07/20/2018   TRIG 68.0 07/08/2017   TRIG 59.0 07/03/2016   Lab Results  Component Value Date   CHOLHDL 2 07/20/2018   CHOLHDL 2 07/08/2017   CHOLHDL 2 07/03/2016   Lab Results  Component Value Date   LDLDIRECT 109.0 10/30/2008  very high HDL with good ratio Overall stable Excellent diet- avoids fatty foods   Glucose fasting 113 P uncle -diabetic late life -heavy drinker/smoker and other health issues  She avoids excess sugar and processed carbs  Great exercise  Stress may be causing this    Other labs Results for orders placed or  performed in visit on 07/20/18  CBC with Differential/Platelet  Result Value Ref Range   WBC 6.5 4.0 - 10.5 K/uL   RBC 4.43 3.87 - 5.11 Mil/uL   Hemoglobin 14.2 12.0 - 15.0 g/dL   HCT 21.3 08.6 - 57.8 %   MCV 94.9 78.0 - 100.0 fl   MCHC 33.8 30.0 - 36.0 g/dL   RDW 46.9 62.9 - 52.8 %   Platelets 247.0 150.0 - 400.0 K/uL   Neutrophils Relative % 49.6 43.0 - 77.0 %   Lymphocytes Relative 39.4 12.0 - 46.0 %   Monocytes Relative 5.5 3.0 - 12.0 %   Eosinophils Relative 4.3 0.0 - 5.0 %   Basophils Relative 1.2 0.0 - 3.0 %   Neutro Abs 3.2 1.4 - 7.7 K/uL   Lymphs Abs 2.5 0.7 - 4.0 K/uL   Monocytes Absolute 0.4 0.1 - 1.0 K/uL   Eosinophils Absolute 0.3 0.0 - 0.7 K/uL   Basophils Absolute 0.1 0.0 - 0.1 K/uL  Comprehensive metabolic panel  Result Value Ref Range   Sodium 137 135 - 145 mEq/L   Potassium 4.5 3.5 - 5.1 mEq/L   Chloride 103  96 - 112 mEq/L   CO2 29 19 - 32 mEq/L   Glucose, Bld 113 (H) 70 - 99 mg/dL   BUN 20 6 - 23 mg/dL   Creatinine, Ser 4.13 0.40 - 1.20 mg/dL   Total Bilirubin 0.4 0.2 - 1.2 mg/dL   Alkaline Phosphatase 54 39 - 117 U/L   AST 18 0 - 37 U/L   ALT 17 0 - 35 U/L   Total Protein 6.5 6.0 - 8.3 g/dL   Albumin 4.0 3.5 - 5.2 g/dL   Calcium 8.8 8.4 - 24.4 mg/dL   GFR 01.02 >72.53 mL/min  Lipid panel  Result Value Ref Range   Cholesterol 203 (H) 0 - 200 mg/dL   Triglycerides 66.4 0.0 - 149.0 mg/dL   HDL 40.34 >74.25 mg/dL   VLDL 95.6 0.0 - 38.7 mg/dL   LDL Cholesterol 564 (H) 0 - 99 mg/dL   Total CHOL/HDL Ratio 2    NonHDL 120.37   TSH  Result Value Ref Range   TSH 3.07 0.35 - 4.50 uIU/mL    Review of Systems  Constitutional: Negative for chills, diaphoresis, fever, malaise/fatigue and weight loss.  HENT: Negative for hearing loss.   Eyes: Negative for blurred vision.  Respiratory: Negative for cough and shortness of breath.   Cardiovascular: Negative for chest pain and palpitations.  Gastrointestinal:       No stool change  Musculoskeletal: Negative for joint pain.  Skin: Negative for itching and rash.  Neurological: Negative for dizziness and headaches.  Endo/Heme/Allergies: Negative for polydipsia.  Psychiatric/Behavioral: Negative for depression, memory loss and suicidal ideas. The patient is nervous/anxious.       Patient Active Problem List   Diagnosis Date Noted  . Increased glucose level 07/27/2018  . Stress reaction 07/27/2018  . Encounter for screening mammogram for breast cancer 02/17/2011  . Routine general medical examination at a health care facility 12/05/2010  . Obsessive-compulsive disorder 10/09/2006  . ALLERGIC RHINITIS 10/09/2006  . DERMATITIS, CONTACT, NOS 10/09/2006   Past Medical History:  Diagnosis Date  . Allergy    seasonal  . Hyperlipidemia   . OCD (obsessive compulsive disorder)    Past Surgical History:  Procedure Laterality Date  . ABDOMINAL  HYSTERECTOMY  2000   Partial, fibroids  . BREAST BIOPSY Left 2006   Negative  . BREAST BIOPSY  Left 2015   Negative  . BREAST SURGERY Left 2006  . COLONOSCOPY  12/2006  . COLONOSCOPY WITH PROPOFOL N/A 12/24/2016   Procedure: COLONOSCOPY WITH PROPOFOL;  Surgeon: [name], MD;  Location: [facility] ENDOSCOPY;  Service: Endoscopy;  Laterality: N/A;  . DOPPLER ECHOCARDIOGRAPHY  1997   Equivocal  . LIPOSUCTION  2010  . OOPHORECTOMY    . TUBAL LIGATION  1992   Social History   Tobacco Use  . Smoking status: Never Smoker  . Smokeless tobacco: Never Used  Substance Use Topics  . Alcohol use: Yes    Alcohol/week: 0.0 standard drinks    Comment: daily-wine/beer   . Drug use: No   Family History  Problem Relation Age of Onset  . Arthritis Mother        osteoarthritis  . Depression Mother   . Hypertension Mother   . Osteoporosis Mother   . Hyperlipidemia Father   . Heart disease Father        Bicuspid aortic valve - replaced; sick sinus syndrome  . Heart disease Other        Heart valve repair  . Cancer Other        1 had Breast CA ; 2 had colon CA  . Breast cancer Other   . Heart disease Paternal Grandfather        MI  . Cancer Paternal Grandfather        Renal   Allergies  Allergen Reactions  . Penicillins     REACTION: reaction not known  . Shellfish Allergy Nausea And Vomiting  . Sulfonamide Derivatives     REACTION: reaction not known   Current Outpatient Medications on File Prior to Visit  Medication Sig Dispense Refill  . Calcium Carbonate-Vitamin D (CALCIUM-VITAMIN D) 600-200 MG-UNIT CAPS Take 1 capsule by mouth 3 (three) times daily.     . Estradiol (VAGIFEM) 10 MCG TABS vaginal tablet Place 20 mcg vaginally 2 (two) times a week.    Marland Kitchen FLUoxetine (PROZAC) 20 MG tablet Take 20 mg by mouth daily.      Marland Kitchen ibuprofen (ADVIL) 200 MG tablet Take 600 mg by mouth every 6 (six) hours as needed.    . naproxen (NAPROSYN) 375 MG tablet Take 375 mg by mouth 2 (two) times  daily as needed.    . Nutritional Supplements (ESTROVEN PO) Take 1 tablet by mouth daily.       No current facility-administered medications on file prior to visit.     Observations/Objective: Patient appears well, in no distress Weight is baseline (good)  No facial swelling or asymmetry Normal voice-not hoarse and no slurred speech No obvious tremor or mobility impairment Moving neck and UEs normally Able to hear the call well  No cough or shortness of breath during interview  Talkative and mentally sharp with no cognitive changes No skin changes on face or neck , no rash or pallor Affect is anxious -pt does discuss stressors easily with excellent insight  Pleasant and talkative    Assessment and Plan: Problem List Items Addressed This Visit      Other   Obsessive-compulsive disorder    Continues fluoxetine-this works for her  Much situational stress creating more anxiety -does not want to inc medication Will continue counseling      Routine general medical examination at a health care facility - Primary    Reviewed health habits including diet and exercise and skin cancer prevention Reviewed appropriate screening tests for age  Also reviewed  health mt list, fam hx and immunization status , as well as social and family history   See HPI Labs are stable (watching glucose closely)-reviewed in detail with patient  Due for mammogram/screen in July  Still working during pandemic - trying to be safe        Increased glucose level    Fasting glucose of 113 despite very good diet/exercise habits and good weight  One uncle with DM (heavy drinker as well)  She attributes her inc glucose to stress (no doubt cortisol is high)  She is working with a Veterinary surgeoncounselor and contemplating job changes/looking at options  Enc her to continue good lifestyle habits disc imp of low glycemic diet and wt loss to prevent DM2  Will check A1C next time      Stress reaction    Moderate to severe  currently due to job issues/expectations  Reviewed stressors/ coping techniques/symptoms/ support sources/ tx options and side effects in detail today  Pt is going to counseling/practicing very good self care  She is looking at all her options for work  If needed- I 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 The patient was provided an opportunity to ask questions and all were answered. The patient agreed with the plan and demonstrated an understanding of the instructions.   The patient was advised to call back or seek an in-person evaluation if the symptoms worsen or if the condition fails to improve as anticipated.   Roxy MannsMarne Jerico Grisso, MD

## 2018-07-27 NOTE — Assessment & Plan Note (Signed)
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

## 2018-07-27 NOTE — Assessment & Plan Note (Signed)
Reviewed health habits including diet and exercise and skin cancer prevention Reviewed appropriate screening tests for age  Also reviewed health mt list, fam hx and immunization status , as well as social and family history   See HPI Labs are stable (watching glucose closely)-reviewed in detail with patient  Due for mammogram/screen in July  Still working during pandemic - trying to be safe

## 2018-07-27 NOTE — Assessment & Plan Note (Signed)
Fasting glucose of 113 despite very good diet/exercise habits and good weight  One uncle with DM (heavy drinker as well)  She attributes her inc glucose to stress (no doubt cortisol is high)  She is working with a Veterinary surgeon and contemplating job changes/looking at options  Enc her to continue good lifestyle habits disc imp of low glycemic diet and wt loss to prevent DM2  Will check A1C next time

## 2018-07-27 NOTE — Patient Instructions (Addendum)
Let me know what I can do for you in terms of job stress/ health in general  Keep up excellent health habits including low glycemic diet and great exercise and weight control  We will get an A1C the next time we check labs   Your mammogram is due in July-do not fortet to schedule that   Continue your calcium and vitamin D

## 2018-07-27 NOTE — Assessment & Plan Note (Signed)
Continues fluoxetine-this works for her  Much situational stress creating more anxiety -does not want to inc medication Will continue counseling

## 2018-08-11 ENCOUNTER — Other Ambulatory Visit: Payer: Self-pay | Admitting: Family Medicine

## 2018-08-11 DIAGNOSIS — Z1231 Encounter for screening mammogram for malignant neoplasm of breast: Secondary | ICD-10-CM

## 2018-09-27 ENCOUNTER — Ambulatory Visit
Admission: RE | Admit: 2018-09-27 | Discharge: 2018-09-27 | Disposition: A | Discharge status: Home / Self Care | Payer: BC Managed Care – PPO | Source: Ambulatory Visit | Attending: Family Medicine | Admitting: Family Medicine

## 2018-09-27 ENCOUNTER — Other Ambulatory Visit: Payer: Self-pay

## 2018-09-27 DIAGNOSIS — Z1231 Encounter for screening mammogram for malignant neoplasm of breast: Secondary | ICD-10-CM | POA: Diagnosis not present

## 2018-10-11 DIAGNOSIS — F429 Obsessive-compulsive disorder, unspecified: Secondary | ICD-10-CM | POA: Diagnosis not present

## 2018-11-19 DIAGNOSIS — L821 Other seborrheic keratosis: Secondary | ICD-10-CM | POA: Diagnosis not present

## 2018-11-19 DIAGNOSIS — D2372 Other benign neoplasm of skin of left lower limb, including hip: Secondary | ICD-10-CM | POA: Diagnosis not present

## 2018-11-22 ENCOUNTER — Encounter: Payer: Self-pay | Admitting: Family Medicine

## 2018-12-27 DIAGNOSIS — H5213 Myopia, bilateral: Secondary | ICD-10-CM | POA: Diagnosis not present

## 2018-12-28 DIAGNOSIS — Z01812 Encounter for preprocedural laboratory examination: Secondary | ICD-10-CM | POA: Diagnosis not present

## 2018-12-28 DIAGNOSIS — Z20828 Contact with and (suspected) exposure to other viral communicable diseases: Secondary | ICD-10-CM | POA: Diagnosis not present

## 2018-12-30 DIAGNOSIS — H02834 Dermatochalasis of left upper eyelid: Secondary | ICD-10-CM | POA: Diagnosis not present

## 2018-12-30 DIAGNOSIS — Z79899 Other long term (current) drug therapy: Secondary | ICD-10-CM | POA: Diagnosis not present

## 2018-12-30 DIAGNOSIS — H02839 Dermatochalasis of unspecified eye, unspecified eyelid: Secondary | ICD-10-CM | POA: Diagnosis not present

## 2018-12-30 DIAGNOSIS — H02831 Dermatochalasis of right upper eyelid: Secondary | ICD-10-CM | POA: Diagnosis not present

## 2018-12-30 DIAGNOSIS — H02403 Unspecified ptosis of bilateral eyelids: Secondary | ICD-10-CM | POA: Diagnosis not present

## 2019-03-29 DIAGNOSIS — Z6820 Body mass index (BMI) 20.0-20.9, adult: Secondary | ICD-10-CM | POA: Diagnosis not present

## 2019-03-29 DIAGNOSIS — Z01419 Encounter for gynecological examination (general) (routine) without abnormal findings: Secondary | ICD-10-CM | POA: Diagnosis not present

## 2019-04-12 ENCOUNTER — Encounter: Payer: Self-pay | Admitting: Family Medicine

## 2019-04-21 DIAGNOSIS — F429 Obsessive-compulsive disorder, unspecified: Secondary | ICD-10-CM | POA: Diagnosis not present

## 2019-07-24 ENCOUNTER — Telehealth: Payer: Self-pay | Admitting: Family Medicine

## 2019-07-24 DIAGNOSIS — Z0000 Encounter for general adult medical examination without abnormal findings: Secondary | ICD-10-CM

## 2019-07-24 DIAGNOSIS — R7309 Other abnormal glucose: Secondary | ICD-10-CM

## 2019-07-24 NOTE — Telephone Encounter (Signed)
-----   Message from Aquilla Solian, RT sent at 07/12/2019  2:46 PM EDT ----- Regarding: Lab Orders for Wednesday 5.26.2021 Please place lab orders for Wednesday 5.26.2021, office visit for physical on Friday 5.28.2021 Thank you, Jones Bales RT(R)

## 2019-07-27 ENCOUNTER — Other Ambulatory Visit (INDEPENDENT_AMBULATORY_CARE_PROVIDER_SITE_OTHER): Payer: BC Managed Care – PPO

## 2019-07-27 DIAGNOSIS — Z0000 Encounter for general adult medical examination without abnormal findings: Secondary | ICD-10-CM

## 2019-07-27 DIAGNOSIS — R7309 Other abnormal glucose: Secondary | ICD-10-CM

## 2019-07-27 LAB — COMPREHENSIVE METABOLIC PANEL
ALT: 19 U/L (ref 0–35)
AST: 20 U/L (ref 0–37)
Albumin: 4.1 g/dL (ref 3.5–5.2)
Alkaline Phosphatase: 58 U/L (ref 39–117)
BUN: 21 mg/dL (ref 6–23)
CO2: 29 mEq/L (ref 19–32)
Calcium: 8.9 mg/dL (ref 8.4–10.5)
Chloride: 105 mEq/L (ref 96–112)
Creatinine, Ser: 0.84 mg/dL (ref 0.40–1.20)
GFR: 68.26 mL/min (ref >60.00)
Glucose, Bld: 97 mg/dL (ref 70–99)
Potassium: 4.3 mEq/L (ref 3.5–5.1)
Sodium: 139 mEq/L (ref 135–145)
Total Bilirubin: 0.4 mg/dL (ref 0.2–1.2)
Total Protein: 6.4 g/dL (ref 6.0–8.3)

## 2019-07-27 LAB — CBC WITH DIFFERENTIAL/PLATELET
Basophils Absolute: 0 10*3/uL (ref 0.0–0.1)
Basophils Relative: 0.6 % (ref 0.0–3.0)
Eosinophils Absolute: 0.2 10*3/uL (ref 0.0–0.7)
Eosinophils Relative: 3.5 % (ref 0.0–5.0)
HCT: 39.3 % (ref 36.0–46.0)
Hemoglobin: 13.5 g/dL (ref 12.0–15.0)
Lymphocytes Relative: 35.8 % (ref 12.0–46.0)
Lymphs Abs: 1.9 10*3/uL (ref 0.7–4.0)
MCHC: 34.2 g/dL (ref 30.0–36.0)
MCV: 94.5 fl (ref 78.0–100.0)
Monocytes Absolute: 0.3 10*3/uL (ref 0.1–1.0)
Monocytes Relative: 6.5 % (ref 3.0–12.0)
Neutro Abs: 2.8 10*3/uL (ref 1.4–7.7)
Neutrophils Relative %: 53.6 % (ref 43.0–77.0)
Platelets: 224 10*3/uL (ref 150.0–400.0)
RBC: 4.16 Mil/uL (ref 3.87–5.11)
RDW: 12.8 % (ref 11.5–15.5)
WBC: 5.3 10*3/uL (ref 4.0–10.5)

## 2019-07-27 LAB — LIPID PANEL
Cholesterol: 190 mg/dL (ref 0–200)
HDL: 83.4 mg/dL
LDL Cholesterol: 97 mg/dL (ref 0–99)
NonHDL: 107.06
Total CHOL/HDL Ratio: 2
Triglycerides: 49 mg/dL (ref 0.0–149.0)
VLDL: 9.8 mg/dL (ref 0.0–40.0)

## 2019-07-27 LAB — HEMOGLOBIN A1C: Hgb A1c MFr Bld: 5.5 % (ref 4.6–6.5)

## 2019-07-27 LAB — TSH: TSH: 2.4 u[IU]/mL (ref 0.35–4.50)

## 2019-07-29 ENCOUNTER — Ambulatory Visit (INDEPENDENT_AMBULATORY_CARE_PROVIDER_SITE_OTHER): Payer: BC Managed Care – PPO | Admitting: Family Medicine

## 2019-07-29 ENCOUNTER — Encounter: Payer: Self-pay | Admitting: Family Medicine

## 2019-07-29 ENCOUNTER — Other Ambulatory Visit: Payer: Self-pay

## 2019-07-29 VITALS — BP 96/62 | HR 77 | Temp 97.0°F | Ht 66.5 in | Wt 132.5 lb

## 2019-07-29 DIAGNOSIS — R7309 Other abnormal glucose: Secondary | ICD-10-CM

## 2019-07-29 DIAGNOSIS — F429 Obsessive-compulsive disorder, unspecified: Secondary | ICD-10-CM

## 2019-07-29 DIAGNOSIS — Z0000 Encounter for general adult medical examination without abnormal findings: Secondary | ICD-10-CM

## 2019-07-29 NOTE — Progress Notes (Signed)
Subjective:    Patient ID: Donna Larson, female    DOB: 1955-11-05, 64 y.o.   MRN: [id number] This visit occurred during the SARS-CoV-2 public health emergency.  Safety protocols were in place, including screening questions prior to the visit, additional usage of staff PPE, and extensive cleaning of exam room while observing appropriate contact time as indicated for disinfecting solutions.    HPI Here for health maintenance exam and to review chronic medical problems    Wt Readings from Last 3 Encounters:  07/29/19 132 lb 8 oz (60.1 kg)  07/13/17 134 lb 8 oz (61 kg)  02/18/17 134 lb (60.8 kg)   21.07 kg/m   Has felt well  Actively working on the stress problem and doing better   Still exercising  Eating well  Hurt her wrist and is now doing PT    covid status-vaccinated  Mammogram 7/20  Self breast exam - no lumps or changes   Has had a hysterectomy  Doing ok with menopause symptoms  Uses vagifem and estroven   Flu shot 9/20 Tdap 12/13  Zoster status  Wants to get shingrix    colonoscopy 10/18   BP Readings from Last 3 Encounters:  07/29/19 96/62  07/13/17 92/60  02/18/17 128/72   Pulse Readings from Last 3 Encounters:  07/29/19 77  07/13/17 77  02/18/17 72     OCD-takes fluoxetine   Cholesterol Lab Results  Component Value Date   CHOL 190 07/27/2019   HDL 83.40 07/27/2019   LDLCALC 97 07/27/2019   LDLDIRECT 109.0 10/30/2008   TRIG 49.0 07/27/2019   CHOLHDL 2 07/27/2019  eating bison instead of beef  Has cut back a lot   Other labs  Results for orders placed or performed in visit on 07/27/19  Hemoglobin A1c  Result Value Ref Range   Hgb A1c MFr Bld 5.5 4.6 - 6.5 %  TSH  Result Value Ref Range   TSH 2.40 0.35 - 4.50 uIU/mL  Lipid panel  Result Value Ref Range   Cholesterol 190 0 - 200 mg/dL   Triglycerides 49.0 0.0 - 149.0 mg/dL   HDL 83.40 >39.00 mg/dL   VLDL 9.8 0.0 - 40.0 mg/dL   LDL Cholesterol 97 0 - 99 mg/dL   Total  CHOL/HDL Ratio 2    NonHDL 107.06   CBC with Differential/Platelet  Result Value Ref Range   WBC 5.3 4.0 - 10.5 K/uL   RBC 4.16 3.87 - 5.11 Mil/uL   Hemoglobin 13.5 12.0 - 15.0 g/dL   HCT 39.3 36.0 - 46.0 %   MCV 94.5 78.0 - 100.0 fl   MCHC 34.2 30.0 - 36.0 g/dL   RDW 12.8 11.5 - 15.5 %   Platelets 224.0 150.0 - 400.0 K/uL   Neutrophils Relative % 53.6 43.0 - 77.0 %   Lymphocytes Relative 35.8 12.0 - 46.0 %   Monocytes Relative 6.5 3.0 - 12.0 %   Eosinophils Relative 3.5 0.0 - 5.0 %   Basophils Relative 0.6 0.0 - 3.0 %   Neutro Abs 2.8 1.4 - 7.7 K/uL   Lymphs Abs 1.9 0.7 - 4.0 K/uL   Monocytes Absolute 0.3 0.1 - 1.0 K/uL   Eosinophils Absolute 0.2 0.0 - 0.7 K/uL   Basophils Absolute 0.0 0.0 - 0.1 K/uL  Comprehensive metabolic panel  Result Value Ref Range   Sodium 139 135 - 145 mEq/L   Potassium 4.3 3.5 - 5.1 mEq/L   Chloride 105 96 - 112 mEq/L   CO2  29 19 - 32 mEq/L   Glucose, Bld 97 70 - 99 mg/dL   BUN 21 6 - 23 mg/dL   Creatinine, Ser 6.04 0.40 - 1.20 mg/dL   Total Bilirubin 0.4 0.2 - 1.2 mg/dL   Alkaline Phosphatase 58 39 - 117 U/L   AST 20 0 - 37 U/L   ALT 19 0 - 35 U/L   Total Protein 6.4 6.0 - 8.3 g/dL   Albumin 4.1 3.5 - 5.2 g/dL   GFR 54.09 >81.19 mL/min   Calcium 8.9 8.4 - 10.5 mg/dL    Patient Active Problem List   Diagnosis Date Noted  . Increased glucose level 07/27/2018  . Stress reaction 07/27/2018  . Encounter for screening mammogram for breast cancer 02/17/2011  . Routine general medical examination at a health care facility 12/05/2010  . Obsessive-compulsive disorder 10/09/2006  . ALLERGIC RHINITIS 10/09/2006  . DERMATITIS, CONTACT, NOS 10/09/2006   Past Medical History:  Diagnosis Date  . Allergy    seasonal  . Hyperlipidemia   . OCD (obsessive compulsive disorder)    Past Surgical History:  Procedure Laterality Date  . ABDOMINAL HYSTERECTOMY  2000   Partial, fibroids  . BREAST BIOPSY Left 2006   Negative  . BREAST BIOPSY Left 2015    Negative  . BREAST SURGERY Left 2006  . COLONOSCOPY  12/2006  . COLONOSCOPY WITH PROPOFOL N/A 12/24/2016   Procedure: COLONOSCOPY WITH PROPOFOL;  Surgeon: [name], MD;  Location: [facility] ENDOSCOPY;  Service: Endoscopy;  Laterality: N/A;  . DOPPLER ECHOCARDIOGRAPHY  1997   Equivocal  . LIPOSUCTION  2010  . OOPHORECTOMY    . TUBAL LIGATION  1992   Social History   Tobacco Use  . Smoking status: Never Smoker  . Smokeless tobacco: Never Used  Substance Use Topics  . Alcohol use: Yes    Alcohol/week: 0.0 standard drinks    Comment: daily-wine/beer   . Drug use: No   Family History  Problem Relation Age of Onset  . Arthritis Mother        osteoarthritis  . Depression Mother   . Hypertension Mother   . Osteoporosis Mother   . Hyperlipidemia Father   . Heart disease Father        Bicuspid aortic valve - replaced; sick sinus syndrome  . Heart disease Other        Heart valve repair  . Cancer Other        1 had Breast CA ; 2 had colon CA  . Breast cancer Other   . Heart disease Paternal Grandfather        MI  . Cancer Paternal Grandfather        Renal   Allergies  Allergen Reactions  . Penicillins     REACTION: reaction not known  . Shellfish Allergy Nausea And Vomiting  . Sulfonamide Derivatives     REACTION: reaction not known   Current Outpatient Medications on File Prior to Visit  Medication Sig Dispense Refill  . Calcium Carbonate-Vitamin D (CALCIUM-VITAMIN D) 600-200 MG-UNIT CAPS Take 1 capsule by mouth 3 (three) times daily.     . Estradiol (VAGIFEM) 10 MCG TABS vaginal tablet Place 20 mcg vaginally 2 (two) times a week.    Marland Kitchen FLUoxetine (PROZAC) 20 MG tablet Take 20 mg by mouth daily.      . Nutritional Supplements (ESTROVEN PO) Take 1 tablet by mouth daily.       No current facility-administered medications on file prior to visit.  Review of Systems  Constitutional: Negative for activity change, appetite change, fatigue, fever and unexpected weight  change.  HENT: Negative for congestion, ear pain, rhinorrhea, sinus pressure and sore throat.   Eyes: Negative for pain, redness and visual disturbance.  Respiratory: Negative for cough, shortness of breath and wheezing.   Cardiovascular: Negative for chest pain and palpitations.  Gastrointestinal: Negative for abdominal pain, blood in stool, constipation and diarrhea.  Endocrine: Negative for polydipsia and polyuria.  Genitourinary: Negative for dysuria, frequency and urgency.  Musculoskeletal: Negative for arthralgias, back pain and myalgias.  Skin: Negative for pallor and rash.  Allergic/Immunologic: Negative for environmental allergies.  Neurological: Negative for dizziness, syncope and headaches.  Hematological: Negative for adenopathy. Does not bruise/bleed easily.  Psychiatric/Behavioral: Negative for decreased concentration and dysphoric mood. The patient is not nervous/anxious.        Objective:   Physical Exam Constitutional:      General: She is not in acute distress.    Appearance: Normal appearance. She is well-developed and normal weight. She is not ill-appearing or diaphoretic.  HENT:     Head: Normocephalic and atraumatic.     Right Ear: Tympanic membrane, ear canal and external ear normal.     Left Ear: Tympanic membrane, ear canal and external ear normal.     Nose: Nose normal. No congestion.     Mouth/Throat:     Mouth: Mucous membranes are moist.     Pharynx: Oropharynx is clear. No posterior oropharyngeal erythema.  Eyes:     General: No scleral icterus.    Extraocular Movements: Extraocular movements intact.     Conjunctiva/sclera: Conjunctivae normal.     Pupils: Pupils are equal, round, and reactive to light.  Neck:     Thyroid: No thyromegaly.     Vascular: No carotid bruit or JVD.  Cardiovascular:     Rate and Rhythm: Normal rate and regular rhythm.     Pulses: Normal pulses.     Heart sounds: Normal heart sounds. No gallop.   Pulmonary:      Effort: Pulmonary effort is normal. No respiratory distress.     Breath sounds: Normal breath sounds. No wheezing.     Comments: Good air exch Chest:     Chest wall: No tenderness.  Abdominal:     General: Bowel sounds are normal. There is no distension or abdominal bruit.     Palpations: Abdomen is soft. There is no mass.     Tenderness: There is no abdominal tenderness.     Hernia: No hernia is present.  Genitourinary:    Comments: Breast exam: No mass, nodules, thickening, tenderness, bulging, retraction, inflamation, nipple discharge or skin changes noted.  No axillary or clavicular LA.     Musculoskeletal:        General: No tenderness. Normal range of motion.     Cervical back: Normal range of motion and neck supple. No rigidity. No muscular tenderness.     Right lower leg: No edema.     Left lower leg: No edema.  Lymphadenopathy:     Cervical: No cervical adenopathy.  Skin:    General: Skin is warm and dry.     Coloration: Skin is not pale.     Findings: No erythema or rash.  Neurological:     Mental Status: She is alert. Mental status is at baseline.     Cranial Nerves: No cranial nerve deficit.     Motor: No abnormal muscle tone.     Coordination:  Coordination normal.     Gait: Gait normal.     Deep Tendon Reflexes: Reflexes are normal and symmetric. Reflexes normal.  Psychiatric:        Mood and Affect: Mood normal.        Cognition and Memory: Cognition and memory normal.     Comments: Pleasant            Assessment & Plan:   Problem List Items Addressed This Visit      Other   Obsessive-compulsive disorder    Doing well  Less stressors  Continues fluoxetine      Routine general medical examination at a health care facility - Primary    Reviewed health habits including diet and exercise and skin cancer prevention Reviewed appropriate screening tests for age  Also reviewed health mt list, fam hx and immunization status , as well as social and family  history   Disc shingrix vaccine  Is vaccinated for covid  Sees gyn- is on vaginal estrogen Enc continues good exercise and health habits  Labs reviewed       Increased glucose level    Well controlled with diet/exercise  Lab Results  Component Value Date   HGBA1C 5.5 07/27/2019   disc imp of low glycemic diet and wt loss to prevent DM2

## 2019-07-29 NOTE — Patient Instructions (Addendum)
If you are interested in the shingles vaccine series (Shingrix), call your insurance or pharmacy to check on coverage and location it must be given.  If affordable - you can schedule it here or at your pharmacy depending on coverage   Take care of yourself ! Keep up the good work   Stay active  Wear sunscreen

## 2019-07-31 NOTE — Assessment & Plan Note (Signed)
Doing well  Less stressors  Continues fluoxetine

## 2019-07-31 NOTE — Assessment & Plan Note (Signed)
Well controlled with diet/exercise  Lab Results  Component Value Date   HGBA1C 5.5 07/27/2019   disc imp of low glycemic diet and wt loss to prevent DM2

## 2019-07-31 NOTE — Assessment & Plan Note (Signed)
Reviewed health habits including diet and exercise and skin cancer prevention Reviewed appropriate screening tests for age  Also reviewed health mt list, fam hx and immunization status , as well as social and family history   Disc shingrix vaccine  Is vaccinated for covid  Sees gyn- is on vaginal estrogen Enc continues good exercise and health habits  Labs reviewed

## 2019-08-02 ENCOUNTER — Encounter: Payer: Self-pay | Admitting: Family Medicine

## 2019-08-04 NOTE — Telephone Encounter (Signed)
I left a detailed message on patient's voice mail Dr.Tower said she could receive the Shingrix shot. I asked patient to call back and schedule appointment.

## 2019-08-04 NOTE — Telephone Encounter (Signed)
Also sent a mychart message letting pt know she can call back and schedule vaccine

## 2019-08-10 ENCOUNTER — Ambulatory Visit (INDEPENDENT_AMBULATORY_CARE_PROVIDER_SITE_OTHER): Payer: BC Managed Care – PPO

## 2019-08-10 DIAGNOSIS — Z23 Encounter for immunization: Secondary | ICD-10-CM

## 2019-08-10 NOTE — Progress Notes (Signed)
Per orders of Dr. Milinda Antis, injection of shingrix given by Sherrie George. Patient tolerated injection well.

## 2019-08-15 ENCOUNTER — Other Ambulatory Visit: Payer: Self-pay | Admitting: Family Medicine

## 2019-08-15 DIAGNOSIS — Z1231 Encounter for screening mammogram for malignant neoplasm of breast: Secondary | ICD-10-CM

## 2019-09-28 ENCOUNTER — Ambulatory Visit
Admission: RE | Admit: 2019-09-28 | Discharge: 2019-09-28 | Disposition: A | Discharge status: Home / Self Care | Payer: BC Managed Care – PPO | Source: Ambulatory Visit | Attending: Family Medicine | Admitting: Family Medicine

## 2019-09-28 DIAGNOSIS — Z1231 Encounter for screening mammogram for malignant neoplasm of breast: Secondary | ICD-10-CM | POA: Diagnosis not present

## 2019-10-13 DIAGNOSIS — F429 Obsessive-compulsive disorder, unspecified: Secondary | ICD-10-CM | POA: Diagnosis not present

## 2019-10-25 ENCOUNTER — Ambulatory Visit (INDEPENDENT_AMBULATORY_CARE_PROVIDER_SITE_OTHER): Payer: BC Managed Care – PPO

## 2019-10-25 ENCOUNTER — Other Ambulatory Visit: Payer: Self-pay

## 2019-10-25 DIAGNOSIS — Z23 Encounter for immunization: Secondary | ICD-10-CM

## 2019-10-25 NOTE — Progress Notes (Signed)
Per orders of Dr. Milinda Antis, injection of Shingrix given by Sherrie George. Patient tolerated injection well.

## 2019-11-24 DIAGNOSIS — D2262 Melanocytic nevi of left upper limb, including shoulder: Secondary | ICD-10-CM | POA: Diagnosis not present

## 2019-11-24 DIAGNOSIS — D225 Melanocytic nevi of trunk: Secondary | ICD-10-CM | POA: Diagnosis not present

## 2019-11-24 DIAGNOSIS — D2261 Melanocytic nevi of right upper limb, including shoulder: Secondary | ICD-10-CM | POA: Diagnosis not present

## 2019-11-24 DIAGNOSIS — D2271 Melanocytic nevi of right lower limb, including hip: Secondary | ICD-10-CM | POA: Diagnosis not present

## 2019-11-29 ENCOUNTER — Encounter: Payer: Self-pay | Admitting: Family Medicine

## 2019-12-29 DIAGNOSIS — H5213 Myopia, bilateral: Secondary | ICD-10-CM | POA: Diagnosis not present

## 2020-01-08 ENCOUNTER — Encounter: Payer: Self-pay | Admitting: Family Medicine

## 2020-01-09 NOTE — Telephone Encounter (Signed)
Updated pt's chart.  

## 2020-04-05 DIAGNOSIS — Z6821 Body mass index (BMI) 21.0-21.9, adult: Secondary | ICD-10-CM | POA: Diagnosis not present

## 2020-04-05 DIAGNOSIS — Z01411 Encounter for gynecological examination (general) (routine) with abnormal findings: Secondary | ICD-10-CM | POA: Diagnosis not present

## 2020-04-05 DIAGNOSIS — Z113 Encounter for screening for infections with a predominantly sexual mode of transmission: Secondary | ICD-10-CM | POA: Diagnosis not present

## 2020-04-05 DIAGNOSIS — Z01419 Encounter for gynecological examination (general) (routine) without abnormal findings: Secondary | ICD-10-CM | POA: Diagnosis not present

## 2020-04-19 DIAGNOSIS — F429 Obsessive-compulsive disorder, unspecified: Secondary | ICD-10-CM | POA: Diagnosis not present

## 2020-07-31 ENCOUNTER — Telehealth: Payer: Self-pay | Admitting: Family Medicine

## 2020-07-31 DIAGNOSIS — R7309 Other abnormal glucose: Secondary | ICD-10-CM

## 2020-07-31 DIAGNOSIS — Z0000 Encounter for general adult medical examination without abnormal findings: Secondary | ICD-10-CM

## 2020-07-31 NOTE — Telephone Encounter (Signed)
-----   Message from Aquilla Solian, RT sent at 07/16/2020  1:52 PM EDT ----- Regarding: Lab Orders for Wednesday 6.1.2022 Please place lab orders for Wednesday 6.1.2022, office visit for physical on Tuesday 6.7.2022 Thank you, Jones Bales RT(R)

## 2020-08-01 ENCOUNTER — Other Ambulatory Visit (INDEPENDENT_AMBULATORY_CARE_PROVIDER_SITE_OTHER): Payer: Medicare Other

## 2020-08-01 ENCOUNTER — Other Ambulatory Visit: Payer: Self-pay

## 2020-08-01 DIAGNOSIS — Z0000 Encounter for general adult medical examination without abnormal findings: Secondary | ICD-10-CM

## 2020-08-01 DIAGNOSIS — R7309 Other abnormal glucose: Secondary | ICD-10-CM | POA: Diagnosis not present

## 2020-08-01 LAB — CBC WITH DIFFERENTIAL/PLATELET
Basophils Absolute: 0 10*3/uL (ref 0.0–0.1)
Basophils Relative: 0.6 % (ref 0.0–3.0)
Eosinophils Absolute: 0.2 10*3/uL (ref 0.0–0.7)
Eosinophils Relative: 2.8 % (ref 0.0–5.0)
HCT: 40.4 % (ref 36.0–46.0)
Hemoglobin: 13.7 g/dL (ref 12.0–15.0)
Lymphocytes Relative: 24.1 % (ref 12.0–46.0)
Lymphs Abs: 1.9 10*3/uL (ref 0.7–4.0)
MCHC: 34 g/dL (ref 30.0–36.0)
MCV: 93.1 fl (ref 78.0–100.0)
Monocytes Absolute: 0.5 10*3/uL (ref 0.1–1.0)
Monocytes Relative: 6.5 % (ref 3.0–12.0)
Neutro Abs: 5.3 10*3/uL (ref 1.4–7.7)
Neutrophils Relative %: 66 % (ref 43.0–77.0)
Platelets: 398 10*3/uL (ref 150.0–400.0)
RBC: 4.34 Mil/uL (ref 3.87–5.11)
RDW: 12.6 % (ref 11.5–15.5)
WBC: 8 10*3/uL (ref 4.0–10.5)

## 2020-08-01 LAB — HEMOGLOBIN A1C: Hgb A1c MFr Bld: 5.9 % (ref 4.6–6.5)

## 2020-08-01 LAB — TSH: TSH: 2.93 u[IU]/mL (ref 0.35–4.50)

## 2020-08-02 LAB — COMPREHENSIVE METABOLIC PANEL
ALT: 16 U/L (ref 0–35)
AST: 15 U/L (ref 0–37)
Albumin: 3.9 g/dL (ref 3.5–5.2)
Alkaline Phosphatase: 74 U/L (ref 39–117)
BUN: 22 mg/dL (ref 6–23)
CO2: 25 mEq/L (ref 19–32)
Calcium: 9.4 mg/dL (ref 8.4–10.5)
Chloride: 103 mEq/L (ref 96–112)
Creatinine, Ser: 0.82 mg/dL (ref 0.40–1.20)
GFR: 75.28 mL/min (ref >60.00)
Glucose, Bld: 92 mg/dL (ref 70–99)
Potassium: 5 mEq/L (ref 3.5–5.1)
Sodium: 140 mEq/L (ref 135–145)
Total Bilirubin: 0.4 mg/dL (ref 0.2–1.2)
Total Protein: 6.6 g/dL (ref 6.0–8.3)

## 2020-08-02 LAB — LIPID PANEL
Cholesterol: 202 mg/dL — ABNORMAL HIGH (ref 0–200)
HDL: 68.4 mg/dL (ref >39.00)
LDL Cholesterol: 122 mg/dL — ABNORMAL HIGH (ref 0–99)
NonHDL: 133.65
Total CHOL/HDL Ratio: 3
Triglycerides: 56 mg/dL (ref 0.0–149.0)
VLDL: 11.2 mg/dL (ref 0.0–40.0)

## 2020-08-04 ENCOUNTER — Encounter: Payer: Self-pay | Admitting: Family Medicine

## 2020-08-07 ENCOUNTER — Ambulatory Visit (INDEPENDENT_AMBULATORY_CARE_PROVIDER_SITE_OTHER): Payer: Medicare Other | Admitting: Family Medicine

## 2020-08-07 ENCOUNTER — Encounter: Payer: Self-pay | Admitting: Family Medicine

## 2020-08-07 ENCOUNTER — Other Ambulatory Visit: Payer: Self-pay

## 2020-08-07 VITALS — BP 106/64 | HR 77 | Temp 96.9°F | Ht 66.5 in | Wt 133.4 lb

## 2020-08-07 DIAGNOSIS — E2839 Other primary ovarian failure: Secondary | ICD-10-CM | POA: Diagnosis not present

## 2020-08-07 DIAGNOSIS — Z0000 Encounter for general adult medical examination without abnormal findings: Secondary | ICD-10-CM

## 2020-08-07 DIAGNOSIS — F429 Obsessive-compulsive disorder, unspecified: Secondary | ICD-10-CM

## 2020-08-07 DIAGNOSIS — Z23 Encounter for immunization: Secondary | ICD-10-CM

## 2020-08-07 DIAGNOSIS — R7309 Other abnormal glucose: Secondary | ICD-10-CM | POA: Diagnosis not present

## 2020-08-07 NOTE — Patient Instructions (Addendum)
For cholesterol  Avoid red meat/ fried foods/ egg yolks/ fatty breakfast meats/ butter, cheese and high fat dairy/ and shellfish    Don't forget to schedule mammogram next month  Pneumonia vaccine today   Schedule your bone density test when you are ready at [facility]  Take care of yourself

## 2020-08-07 NOTE — Assessment & Plan Note (Signed)
Mood is stable with fluoxetine 20 mg daily  Pt manages work env closely  Good self care

## 2020-08-07 NOTE — Assessment & Plan Note (Signed)
Good management with diet/exercise  Lab Results  Component Value Date   HGBA1C 5.9 08/01/2020   disc imp of low glycemic diet and wt loss to prevent DM2

## 2020-08-07 NOTE — Progress Notes (Signed)
Subjective:    Patient ID: Donna Larson, female    DOB: 1956-01-19, 65 y.o.   MRN: [id number]  This visit occurred during the SARS-CoV-2 public health emergency.  Safety protocols were in place, including screening questions prior to the visit, additional usage of staff PPE, and extensive cleaning of exam room while observing appropriate contact time as indicated for disinfecting solutions.    HPI Pt presents for a welcome to medicare visit   I have personally reviewed the Medicare Annual Wellness questionnaire and have noted 1. The patient's medical and social history 2. Their use of alcohol, tobacco or illicit drugs 3. Their current medications and supplements 4. The patient's functional ability including ADL's, fall risks, home safety risks and hearing or visual             impairment. 5. Diet and physical activities 6. Evidence for depression or mood disorders  The patients weight, height, BMI have been recorded in the chart and visual acuity is per eye clinic.  I have made referrals, counseling and provided education to the patient based review of the above and I have provided the pt with a written personalized care plan for preventive services. Reviewed and updated provider list, see scanned forms.  See scanned forms.  Routine anticipatory guidance given to patient.  See health maintenance. Colon cancer screening-colonoscopy 10/18  Breast cancer screening  Mammogram 7/21 Self breast exam- no lumps  Sees gyn and uses a vaginal estrogen tablet , last visit 2/22 Flu vaccine 9/21 covid-vaccinated with booster  Tetanus vaccine -Tdap 12/13 Pneumovax due - pna 20  Zoster vaccine- had shingrix  Dexa=nl at [facility] in 2012 Falls-none Fractures-none  Supplements- taking ca and D Exercise - walks/sit ups /stretching   Advance directive-utd  Cognitive function addressed- see scanned forms- and if abnormal then additional documentation follows.  Normal/no issues and still works  full time  PMH and SH reviewed  Meds, vitals, and allergies reviewed.   ROS: See HPI.  Otherwise negative.    Weight : Wt Readings from Last 3 Encounters:  08/07/20 133 lb 6 oz (60.5 kg)  07/29/19 132 lb 8 oz (60.1 kg)  07/13/17 134 lb 8 oz (61 kg)   21.20 kg/m   Hearing/vision:  Hearing Screening   125Hz  250Hz  500Hz  1000Hz  2000Hz  3000Hz  4000Hz  6000Hz  8000Hz   Right ear:   40 40 40  40    Left ear:   40 40 40  40      Visual Acuity Screening   Right eye Left eye Both eyes  Without correction:     With correction: 20/30 20/25 20/25    May need new glasses    Care team Noe Goyer-pcp Byrnett- [facility]- E[facility]  Mood -pretty good lately  Stress management at work has been better  H/o OCD  Has been doing pretty well overall  Working a lot  Some planned time off  Plans to retire in 2025    Cholesterol  Lab Results  Component Value Date   CHOL 202 (H) 08/01/2020   CHOL 190 07/27/2019   CHOL 203 (H) 07/20/2018   Lab Results  Component Value Date   HDL 68.40 08/01/2020   HDL 83.40 07/27/2019   HDL 82.70 07/20/2018   Lab Results  Component Value Date   LDLCALC 122 (H) 08/01/2020   LDLCALC 97 07/27/2019   LDLCALC 110 (H) 07/20/2018   Lab Results  Component Value Date   TRIG 56.0 08/01/2020   TRIG 49.0 07/27/2019  TRIG 50.0 07/20/2018   Lab Results  Component Value Date   CHOLHDL 3 08/01/2020   CHOLHDL 2 07/27/2019   CHOLHDL 2 07/20/2018   Lab Results  Component Value Date   LDLDIRECT 109.0 10/30/2008   She had a lot more fat in diet right before labs (hamburger and steak and ribs) Prior to this-ate low fat game  Has to work on this with husband  Has a family history of high chol/vasc trouble   Elevated glucose Lab Results  Component Value Date   HGBA1C 5.9 08/01/2020   Really watching it   Other labs Lab Results  Component Value Date   CREATININE 0.82 08/01/2020   BUN 22 08/01/2020   NA 140 08/01/2020   K 5.0 08/01/2020   CL 103  08/01/2020   CO2 25 08/01/2020   Lab Results  Component Value Date   WBC 8.0 08/01/2020   HGB 13.7 08/01/2020   HCT 40.4 08/01/2020   MCV 93.1 08/01/2020   PLT 398.0 08/01/2020   Lab Results  Component Value Date   ALT 16 08/01/2020   AST 15 08/01/2020   ALKPHOS 74 08/01/2020   BILITOT 0.4 08/01/2020    Lab Results  Component Value Date   TSH 2.93 08/01/2020    Patient Active Problem List   Diagnosis Date Noted  . Welcome to Medicare preventive visit 08/07/2020  . Estrogen deficiency 08/07/2020  . Increased glucose level 07/27/2018  . Encounter for screening mammogram for breast cancer 02/17/2011  . Routine general medical examination at a health care facility 12/05/2010  . Obsessive-compulsive disorder 10/09/2006  . ALLERGIC RHINITIS 10/09/2006  . DERMATITIS, CONTACT, NOS 10/09/2006   Past Medical History:  Diagnosis Date  . Allergy    seasonal  . Hyperlipidemia   . OCD (obsessive compulsive disorder)    Past Surgical History:  Procedure Laterality Date  . ABDOMINAL HYSTERECTOMY  2000   Partial, fibroids  . BREAST BIOPSY Left 2006   Negative  . BREAST BIOPSY Left 2015   Negative  . BREAST SURGERY Left 2006  . COLONOSCOPY  12/2006  . COLONOSCOPY WITH PROPOFOL N/A 12/24/2016   Procedure: COLONOSCOPY WITH PROPOFOL;  Surgeon: Earline MayotteByrnett, Jeffrey W, MD;  Location: [facility] ENDOSCOPY;  Service: Endoscopy;  Laterality: N/A;  . DOPPLER ECHOCARDIOGRAPHY  1997   Equivocal  . LIPOSUCTION  2010  . OOPHORECTOMY    . TUBAL LIGATION  1992   Social History   Tobacco Use  . Smoking status: Never Smoker  . Smokeless tobacco: Never Used  Substance Use Topics  . Alcohol use: Yes    Alcohol/week: 0.0 standard drinks    Comment: daily-wine/beer   . Drug use: No   Family History  Problem Relation Age of Onset  . Arthritis Mother        osteoarthritis  . Depression Mother   . Hypertension Mother   . Osteoporosis Mother   . Hyperlipidemia Father   . Heart disease Father         Bicuspid aortic valve - replaced; sick sinus syndrome  . Heart disease Other        Heart valve repair  . Cancer Other        1 had Breast CA ; 2 had colon CA  . Breast cancer Other   . Heart disease Paternal Grandfather        MI  . Cancer Paternal Grandfather        Renal   Allergies  Allergen Reactions  . Penicillins  REACTION: reaction not known  . Shellfish Allergy Nausea And Vomiting  . Sulfonamide Derivatives     REACTION: reaction not known   Current Outpatient Medications on File Prior to Visit  Medication Sig Dispense Refill  . Calcium Carbonate-Vitamin D (CALCIUM-VITAMIN D) 600-200 MG-UNIT CAPS Take 1 capsule by mouth 3 (three) times daily.    . Estradiol 10 MCG TABS vaginal tablet Place 20 mcg vaginally 2 (two) times a week.    Marland Kitchen FLUoxetine (PROZAC) 20 MG tablet Take 20 mg by mouth daily.    . Nutritional Supplements (ESTROVEN PO) Take 1 tablet by mouth daily.     No current facility-administered medications on file prior to visit.    Review of Systems  Constitutional: Negative for activity change, appetite change, fatigue, fever and unexpected weight change.  HENT: Negative for congestion, ear pain, rhinorrhea, sinus pressure and sore throat.   Eyes: Negative for pain, redness and visual disturbance.  Respiratory: Negative for cough, shortness of breath and wheezing.   Cardiovascular: Negative for chest pain and palpitations.  Gastrointestinal: Negative for abdominal pain, blood in stool, constipation and diarrhea.  Endocrine: Negative for polydipsia and polyuria.  Genitourinary: Negative for dysuria, frequency and urgency.  Musculoskeletal: Negative for arthralgias, back pain and myalgias.  Skin: Negative for pallor and rash.  Allergic/Immunologic: Negative for environmental allergies.  Neurological: Negative for dizziness, syncope and headaches.  Hematological: Negative for adenopathy. Does not bruise/bleed easily.  Psychiatric/Behavioral: Negative  for decreased concentration and dysphoric mood. The patient is not nervous/anxious.        Stress level is improved       Objective:   Physical Exam Constitutional:      General: She is not in acute distress.    Appearance: Normal appearance. She is well-developed and normal weight. She is not ill-appearing or diaphoretic.  HENT:     Head: Normocephalic and atraumatic.     Right Ear: Tympanic membrane, ear canal and external ear normal.     Left Ear: Tympanic membrane, ear canal and external ear normal.     Nose: Nose normal. No congestion.     Mouth/Throat:     Mouth: Mucous membranes are moist.     Pharynx: Oropharynx is clear. No posterior oropharyngeal erythema.  Eyes:     General: No scleral icterus.    Extraocular Movements: Extraocular movements intact.     Conjunctiva/sclera: Conjunctivae normal.     Pupils: Pupils are equal, round, and reactive to light.  Neck:     Thyroid: No thyromegaly.     Vascular: No carotid bruit or JVD.  Cardiovascular:     Rate and Rhythm: Normal rate and regular rhythm.     Pulses: Normal pulses.     Heart sounds: Normal heart sounds. No gallop.   Pulmonary:     Effort: Pulmonary effort is normal. No respiratory distress.     Breath sounds: Normal breath sounds. No wheezing.     Comments: Good air exch Chest:     Chest wall: No tenderness.  Abdominal:     General: Bowel sounds are normal. There is no distension or abdominal bruit.     Palpations: Abdomen is soft. There is no mass.     Tenderness: There is no abdominal tenderness.     Hernia: No hernia is present.  Genitourinary:    Comments: Breast exam: No mass, nodules, thickening, tenderness, bulging, retraction, inflamation, nipple discharge or skin changes noted.  No axillary or clavicular LA.  Musculoskeletal:        General: No tenderness. Normal range of motion.     Cervical back: Normal range of motion and neck supple. No rigidity. No muscular tenderness.     Right lower  leg: No edema.     Left lower leg: No edema.     Comments: No kyphosis   Lymphadenopathy:     Cervical: No cervical adenopathy.  Skin:    General: Skin is warm and dry.     Coloration: Skin is not pale.     Findings: No erythema or rash.     Comments: Solar lentigines diffusely   Neurological:     Mental Status: She is alert. Mental status is at baseline.     Cranial Nerves: No cranial nerve deficit.     Motor: No abnormal muscle tone.     Coordination: Coordination normal.     Gait: Gait normal.     Deep Tendon Reflexes: Reflexes are normal and symmetric. Reflexes normal.  Psychiatric:        Mood and Affect: Mood normal.        Cognition and Memory: Cognition and memory normal.     Comments: Pleasant            Assessment & Plan:   Problem List Items Addressed This Visit      Other   Obsessive-compulsive disorder    Mood is stable with fluoxetine 20 mg daily  Pt manages work env closely  Good self care      Increased glucose level    Good management with diet/exercise  Lab Results  Component Value Date   HGBA1C 5.9 08/01/2020   disc imp of low glycemic diet and wt loss to prevent DM2       Welcome to Medicare preventive visit - Primary    Reviewed health habits including diet and exercise and skin cancer prevention Reviewed appropriate screening tests for age  Also reviewed health mt list, fam hx and immunization status , as well as social and family history   See HPI Labs reviewed  covid immunized pna 20 vaccine today  dexa ordered (no falls or fx) Advance directive is utd  No cognitive concerns Nl hearing screen and vision screen (glasses) Mood is good/stable        Estrogen deficiency   Relevant Orders   DG Bone Density

## 2020-08-07 NOTE — Assessment & Plan Note (Signed)
Reviewed health habits including diet and exercise and skin cancer prevention Reviewed appropriate screening tests for age  Also reviewed health mt list, fam hx and immunization status , as well as social and family history   See HPI Labs reviewed  covid immunized pna 20 vaccine today  dexa ordered (no falls or fx) Advance directive is utd  No cognitive concerns Nl hearing screen and vision screen (glasses) Mood is good/stable

## 2020-08-24 ENCOUNTER — Other Ambulatory Visit: Payer: Self-pay | Admitting: Family Medicine

## 2020-08-24 DIAGNOSIS — Z1231 Encounter for screening mammogram for malignant neoplasm of breast: Secondary | ICD-10-CM

## 2020-10-01 ENCOUNTER — Other Ambulatory Visit: Payer: Self-pay

## 2020-10-01 ENCOUNTER — Ambulatory Visit
Admission: RE | Admit: 2020-10-01 | Discharge: 2020-10-01 | Disposition: A | Discharge status: Home / Self Care | Payer: Medicare Other | Source: Ambulatory Visit | Attending: Family Medicine | Admitting: Family Medicine

## 2020-10-01 DIAGNOSIS — Z1231 Encounter for screening mammogram for malignant neoplasm of breast: Secondary | ICD-10-CM

## 2020-10-01 DIAGNOSIS — E2839 Other primary ovarian failure: Secondary | ICD-10-CM | POA: Insufficient documentation

## 2020-10-07 ENCOUNTER — Encounter: Payer: Self-pay | Admitting: Family Medicine

## 2020-10-07 DIAGNOSIS — M8580 Other specified disorders of bone density and structure, unspecified site: Secondary | ICD-10-CM | POA: Insufficient documentation

## 2020-12-18 ENCOUNTER — Encounter: Payer: Self-pay | Admitting: Family Medicine

## 2020-12-21 ENCOUNTER — Ambulatory Visit: Payer: BLUE CROSS/BLUE SHIELD

## 2020-12-21 ENCOUNTER — Ambulatory Visit (INDEPENDENT_AMBULATORY_CARE_PROVIDER_SITE_OTHER): Payer: Medicare Other

## 2020-12-21 DIAGNOSIS — Z23 Encounter for immunization: Secondary | ICD-10-CM

## 2020-12-22 ENCOUNTER — Encounter: Payer: Self-pay | Admitting: Family Medicine

## 2021-07-28 ENCOUNTER — Encounter: Payer: Self-pay | Admitting: Family Medicine

## 2021-08-01 ENCOUNTER — Telehealth (INDEPENDENT_AMBULATORY_CARE_PROVIDER_SITE_OTHER): Payer: Medicare Other | Admitting: Family Medicine

## 2021-08-01 ENCOUNTER — Other Ambulatory Visit (INDEPENDENT_AMBULATORY_CARE_PROVIDER_SITE_OTHER): Payer: Medicare Other

## 2021-08-01 DIAGNOSIS — Z0000 Encounter for general adult medical examination without abnormal findings: Secondary | ICD-10-CM

## 2021-08-01 DIAGNOSIS — R7309 Other abnormal glucose: Secondary | ICD-10-CM

## 2021-08-01 DIAGNOSIS — M8580 Other specified disorders of bone density and structure, unspecified site: Secondary | ICD-10-CM

## 2021-08-01 LAB — COMPREHENSIVE METABOLIC PANEL
ALT: 21 U/L (ref 0–35)
AST: 19 U/L (ref 0–37)
Albumin: 4 g/dL (ref 3.5–5.2)
Alkaline Phosphatase: 50 U/L (ref 39–117)
BUN: 21 mg/dL (ref 6–23)
CO2: 28 mEq/L (ref 19–32)
Calcium: 9.2 mg/dL (ref 8.4–10.5)
Chloride: 104 mEq/L (ref 96–112)
Creatinine, Ser: 0.81 mg/dL (ref 0.40–1.20)
GFR: 75.86 mL/min (ref >60.00)
Glucose, Bld: 103 mg/dL — ABNORMAL HIGH (ref 70–99)
Potassium: 4.7 mEq/L (ref 3.5–5.1)
Sodium: 141 mEq/L (ref 135–145)
Total Bilirubin: 0.5 mg/dL (ref 0.2–1.2)
Total Protein: 6 g/dL (ref 6.0–8.3)

## 2021-08-01 LAB — CBC WITH DIFFERENTIAL/PLATELET
Basophils Absolute: 0 10*3/uL (ref 0.0–0.1)
Basophils Relative: 0.8 % (ref 0.0–3.0)
Eosinophils Absolute: 0.3 10*3/uL (ref 0.0–0.7)
Eosinophils Relative: 5.1 % — ABNORMAL HIGH (ref 0.0–5.0)
HCT: 37.8 % (ref 36.0–46.0)
Hemoglobin: 12.6 g/dL (ref 12.0–15.0)
Lymphocytes Relative: 27.7 % (ref 12.0–46.0)
Lymphs Abs: 1.5 10*3/uL (ref 0.7–4.0)
MCHC: 33.5 g/dL (ref 30.0–36.0)
MCV: 95.3 fl (ref 78.0–100.0)
Monocytes Absolute: 0.4 10*3/uL (ref 0.1–1.0)
Monocytes Relative: 6.7 % (ref 3.0–12.0)
Neutro Abs: 3.3 10*3/uL (ref 1.4–7.7)
Neutrophils Relative %: 59.7 % (ref 43.0–77.0)
Platelets: 244 10*3/uL (ref 150.0–400.0)
RBC: 3.96 Mil/uL (ref 3.87–5.11)
RDW: 12.5 % (ref 11.5–15.5)
WBC: 5.5 10*3/uL (ref 4.0–10.5)

## 2021-08-01 LAB — LIPID PANEL
Cholesterol: 206 mg/dL — ABNORMAL HIGH (ref 0–200)
HDL: 88.7 mg/dL (ref >39.00)
LDL Cholesterol: 108 mg/dL — ABNORMAL HIGH (ref 0–99)
NonHDL: 117.04
Total CHOL/HDL Ratio: 2
Triglycerides: 45 mg/dL (ref 0.0–149.0)
VLDL: 9 mg/dL (ref 0.0–40.0)

## 2021-08-01 LAB — TSH: TSH: 2.72 u[IU]/mL (ref 0.35–5.50)

## 2021-08-01 LAB — HEMOGLOBIN A1C: Hgb A1c MFr Bld: 5.6 % (ref 4.6–6.5)

## 2021-08-01 NOTE — Telephone Encounter (Signed)
Heads up, insurance will not cover cbc She will need to sign waiver Thanks

## 2021-08-01 NOTE — Telephone Encounter (Signed)
-----   Message from Alvina Chou sent at 08/01/2021  7:22 AM EDT ----- Regarding: lab orders for now Patient is scheduled for CPX labs, please order future labs, Thanks , Terri

## 2021-08-08 ENCOUNTER — Encounter: Payer: Self-pay | Admitting: Family Medicine

## 2021-08-08 ENCOUNTER — Ambulatory Visit (INDEPENDENT_AMBULATORY_CARE_PROVIDER_SITE_OTHER): Payer: Medicare Other | Admitting: Family Medicine

## 2021-08-08 VITALS — BP 112/72 | HR 64 | Ht 67.0 in | Wt 136.6 lb

## 2021-08-08 DIAGNOSIS — M8580 Other specified disorders of bone density and structure, unspecified site: Secondary | ICD-10-CM | POA: Diagnosis not present

## 2021-08-08 DIAGNOSIS — Z0000 Encounter for general adult medical examination without abnormal findings: Secondary | ICD-10-CM

## 2021-08-08 DIAGNOSIS — R7309 Other abnormal glucose: Secondary | ICD-10-CM | POA: Diagnosis not present

## 2021-08-08 DIAGNOSIS — F429 Obsessive-compulsive disorder, unspecified: Secondary | ICD-10-CM
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
## 2021-08-08 NOTE — Assessment & Plan Note (Signed)
Lab Results  Component Value Date   HGBA1C 5.6 08/01/2021   Improved disc imp of low glycemic diet and wt loss to prevent DM2

## 2021-08-08 NOTE — Assessment & Plan Note (Signed)
Reviewed health habits including diet and exercise and skin cancer prevention Reviewed appropriate screening tests for age  Also reviewed health mt list, fam hx and immunization status , as well as social and family history   See HPI Labs reviewed  Colonoscopy and mammogram utd Plans to get tetanus shot at pharmacy dexa utd , rev fall prev Taking ca, D and exercises  Adv directive utd No cog concerns, still working  Disc mood/stressors  , phq 0 Nl hearing and vision screen Independent for ADLs , good function

## 2021-08-08 NOTE — Patient Instructions (Addendum)
Keep up the great health habits Wear sun protection   Exercise   Labs looks good

## 2021-08-08 NOTE — Progress Notes (Signed)
Subjective:    Patient ID: Donna Larson, female    DOB: [birth year], 66 y.o.   MRN: [id number]  HPI Pt presents for amw and annual f/u of chronic medical conditions  I have personally reviewed the Medicare Annual Wellness questionnaire and have noted 1. The patient's medical and social history 2. Their use of alcohol, tobacco or illicit drugs 3. Their current medications and supplements 4. The patient's functional ability including ADL's, fall risks, home safety risks and hearing or visual             impairment. 5. Diet and physical activities 6. Evidence for depression or mood disorders  The patients weight, height, BMI have been recorded in the chart and visual acuity is per eye clinic.  I have made referrals, counseling and provided education to the patient based review of the above and I have provided the pt with a written personalized care plan for preventive services. Reviewed and updated provider list, see scanned forms.  See scanned forms.  Routine anticipatory guidance given to patient.  See health maintenance. Colon cancer screening  colonoscopy 12/2016 with a 10 y recall  Breast cancer screening  10/2020 Self breast exam Uses vaginal estrogen and estroven  for menopausal symptoms  Hysterectomy in the past  Flu vaccine 12/2020 Tetanus vaccine  02/2012 Pneumovax up to date Zoster vaccine :had shingrix series Dexa 10/2020 mild osteopenia  Falls Fractures Supplements ca and D Exercise   Prostate cancer screening Advance directive Cognitive function addressed- see scanned forms- and if abnormal then additional documentation follows.   PMH and SH reviewed  Meds, vitals, and allergies reviewed.   ROS: See HPI.  Otherwise negative.    Weight :  Hearing/vision: Hearing Screening   500Hz  1000Hz  2000Hz  4000Hz   Right ear 40 40 40 40  Left ear 40 40 40 40   Vision Screening   Right eye Left eye Both eyes  Without correction     With correction 20/25 20/30  20/25      PHQ:    08/08/2021    9:04 AM 08/07/2020    8:23 AM 07/29/2019    9:11 AM 07/27/2018    8:48 AM 07/13/2017    8:30 AM  Depression screen PHQ 2/9  Decreased Interest 0 0 0 0 0  Down, Depressed, Hopeless 0 0 0 0 0  PHQ - 2 Score 0 0 0 0 0  Altered sleeping   1    Tired, decreased energy   0    Change in appetite   0    Feeling bad or failure about yourself    0    Trouble concentrating   0    Moving slowly or fidgety/restless   0    Suicidal thoughts   0    PHQ-9 Score   1    Difficult doing work/chores   Not difficult at all       H/o OCD Controlled with fluoxetine 20 mg daily    ADLs : independent   Functionality: no help needed   Care team : Rhett Mutschler-pcp Byrnett- [facility]- E[facility]  BP Readings from Last 3 Encounters:  08/08/21 112/72  08/07/20 106/64  07/29/19 96/62   Pulse Readings from Last 3 Encounters:  08/08/21 64  08/07/20 77  07/29/19 77     Cholesterol Lab Results  Component Value Date   CHOL 206 (H) 08/01/2021   CHOL 202 (H) 08/01/2020   CHOL 190 07/27/2019   Lab Results  Component  Value Date   HDL 88.70 08/01/2021   HDL 68.40 08/01/2020   HDL 83.40 07/27/2019   Lab Results  Component Value Date   LDLCALC 108 (H) 08/01/2021   LDLCALC 122 (H) 08/01/2020   LDLCALC 97 07/27/2019   Lab Results  Component Value Date   TRIG 45.0 08/01/2021   TRIG 56.0 08/01/2020   TRIG 49.0 07/27/2019   Lab Results  Component Value Date   CHOLHDL 2 08/01/2021   CHOLHDL 3 08/01/2020   CHOLHDL 2 07/27/2019   Lab Results  Component Value Date   LDLDIRECT 109.0 10/30/2008    H/o elevated glucose level Lab Results  Component Value Date   HGBA1C 5.6 08/01/2021    Other labs Lab Results  Component Value Date   CREATININE 0.81 08/01/2021   BUN 21 08/01/2021   NA 141 08/01/2021   K 4.7 08/01/2021   CL 104 08/01/2021   CO2 28 08/01/2021   Lab Results  Component Value Date   ALT 21 08/01/2021   AST 19 08/01/2021   ALKPHOS 50  08/01/2021   BILITOT 0.5 08/01/2021   Lab Results  Component Value Date   TSH 2.72 08/01/2021   Lab Results  Component Value Date   WBC 5.5 08/01/2021   HGB 12.6 08/01/2021   HCT 37.8 08/01/2021   MCV 95.3 08/01/2021   PLT 244.0 08/01/2021   Patient Active Problem List   Diagnosis Date Noted   Medicare annual wellness visit, initial 08/08/2021   Osteopenia 10/07/2020   Welcome to Medicare preventive visit 08/07/2020   Estrogen deficiency 08/07/2020   Increased glucose level 07/27/2018   Encounter for screening mammogram for breast cancer 02/17/2011   Routine general medical examination at a health care facility 12/05/2010   Obsessive-compulsive disorder 10/09/2006   ALLERGIC RHINITIS 10/09/2006   DERMATITIS, CONTACT, NOS 10/09/2006   Past Medical History:  Diagnosis Date   Allergy    seasonal   Hyperlipidemia    OCD (obsessive compulsive disorder)    Past Surgical History:  Procedure Laterality Date   ABDOMINAL HYSTERECTOMY  2000   Partial, fibroids   BREAST BIOPSY Left 2006   Negative   BREAST BIOPSY Left 2015   Negative   BREAST SURGERY Left 2006   COLONOSCOPY  12/2006   COLONOSCOPY WITH PROPOFOL N/A 12/24/2016   Procedure: COLONOSCOPY WITH PROPOFOL;  Surgeon: Earline MayotteByrnett, Jeffrey W, MD;  Location: [facility] ENDOSCOPY;  Service: Endoscopy;  Laterality: N/A;   DOPPLER ECHOCARDIOGRAPHY  1997   Equivocal   LIPOSUCTION  2010   OOPHORECTOMY     TUBAL LIGATION  1992   Social History   Tobacco Use   Smoking status: Never   Smokeless tobacco: Never  Vaping Use   Vaping Use: Never used  Substance Use Topics   Alcohol use: Yes    Alcohol/week: 0.0 standard drinks of alcohol    Comment: daily-wine/beer    Drug use: No   Family History  Problem Relation Age of Onset   Arthritis Mother        osteoarthritis   Depression Mother    Hypertension Mother    Osteoporosis Mother    Hyperlipidemia Father    Heart disease Father        Bicuspid aortic valve - replaced;  sick sinus syndrome   Heart disease Other        Heart valve repair   Cancer Other        1 had Breast CA ; 2 had colon CA   Breast  cancer Other    Heart disease Paternal Grandfather        MI   Cancer Paternal Grandfather        Renal   Allergies  Allergen Reactions   Penicillins     REACTION: reaction not known   Shellfish Allergy Nausea And Vomiting   Sulfonamide Derivatives     REACTION: reaction not known   Current Outpatient Medications on File Prior to Visit  Medication Sig Dispense Refill   Calcium Carbonate-Vitamin D (CALCIUM-VITAMIN D) 600-200 MG-UNIT CAPS Take 1 capsule by mouth 3 (three) times daily.     Estradiol 10 MCG TABS vaginal tablet Place 20 mcg vaginally 2 (two) times a week.     FLUoxetine (PROZAC) 20 MG tablet Take 20 mg by mouth daily.     Nutritional Supplements (ESTROVEN PO) Take 1 tablet by mouth daily.     No current facility-administered medications on file prior to visit.      Review of Systems  Constitutional:  Negative for activity change, appetite change, fatigue, fever and unexpected weight change.  HENT:  Negative for congestion, ear pain, rhinorrhea, sinus pressure and sore throat.   Eyes:  Negative for pain, redness and visual disturbance.  Respiratory:  Negative for cough, shortness of breath and wheezing.   Cardiovascular:  Negative for chest pain and palpitations.  Gastrointestinal:  Negative for abdominal pain, blood in stool, constipation and diarrhea.  Endocrine: Negative for polydipsia and polyuria.  Genitourinary:  Negative for dysuria, frequency and urgency.  Musculoskeletal:  Negative for arthralgias, back pain and myalgias.  Skin:  Negative for pallor and rash.  Allergic/Immunologic: Negative for environmental allergies.  Neurological:  Negative for dizziness, syncope and headaches.  Hematological:  Negative for adenopathy. Does not bruise/bleed easily.  Psychiatric/Behavioral:  Negative for decreased concentration and  dysphoric mood. The patient is not nervous/anxious.        High stress       Objective:   Physical Exam Constitutional:      General: She is not in acute distress.    Appearance: Normal appearance. She is well-developed. She is not ill-appearing or diaphoretic.  HENT:     Head: Normocephalic and atraumatic.     Right Ear: Tympanic membrane, ear canal and external ear normal.     Left Ear: Tympanic membrane, ear canal and external ear normal.     Nose: Nose normal. No congestion.     Mouth/Throat:     Mouth: Mucous membranes are moist.     Pharynx: Oropharynx is clear. No posterior oropharyngeal erythema.  Eyes:     General: No scleral icterus.    Extraocular Movements: Extraocular movements intact.     Conjunctiva/sclera: Conjunctivae normal.     Pupils: Pupils are equal, round, and reactive to light.  Neck:     Thyroid: No thyromegaly.     Vascular: No carotid bruit or JVD.  Cardiovascular:     Rate and Rhythm: Normal rate and regular rhythm.     Pulses: Normal pulses.     Heart sounds: Normal heart sounds.     No gallop.  Pulmonary:     Effort: Pulmonary effort is normal. No respiratory distress.     Breath sounds: Normal breath sounds. No wheezing.     Comments: Good air exch Chest:     Chest wall: No tenderness.  Abdominal:     General: Bowel sounds are normal. There is no distension or abdominal bruit.     Palpations: Abdomen is soft.  There is no mass.     Tenderness: There is no abdominal tenderness.     Hernia: No hernia is present.  Genitourinary:    Comments: Breast and pelvic exam done by gyn   Musculoskeletal:        General: No tenderness. Normal range of motion.     Cervical back: Normal range of motion and neck supple. No rigidity. No muscular tenderness.     Right lower leg: No edema.     Left lower leg: No edema.     Comments: No kyphosis   Lymphadenopathy:     Cervical: No cervical adenopathy.  Skin:    General: Skin is warm and dry.      Coloration: Skin is not pale.     Findings: No erythema or rash.     Comments: Solar lentigines diffusely   Neurological:     Mental Status: She is alert. Mental status is at baseline.     Cranial Nerves: No cranial nerve deficit.     Motor: No abnormal muscle tone.     Coordination: Coordination normal.     Gait: Gait normal.     Deep Tendon Reflexes: Reflexes are normal and symmetric.  Psychiatric:        Mood and Affect: Mood normal.        Cognition and Memory: Cognition and memory normal.           Assessment & Plan:   Problem List Items Addressed This Visit       Musculoskeletal and Integument   Osteopenia    Rev dexa 10/2020 Continues ca and D One fall/no fx Disc fall prev Disc exercise effort          Other   Increased glucose level    Lab Results  Component Value Date   HGBA1C 5.6 08/01/2021  Improved disc imp of low glycemic diet and wt loss to prevent DM2       Medicare annual wellness visit, initial - Primary    Reviewed health habits including diet and exercise and skin cancer prevention Reviewed appropriate screening tests for age  Also reviewed health mt list, fam hx and immunization status , as well as social and family history   See HPI Labs reviewed  Colonoscopy and mammogram utd Plans to get tetanus shot at pharmacy dexa utd , rev fall prev Taking ca, D and exercises  Adv directive utd No cog concerns, still working  Disc mood/stressors  , phq 0 Nl hearing and vision screen Independent for ADLs , good function        Obsessive-compulsive disorder    Reviewed stressors/ coping techniques/symptoms/ support sources/ tx options and side effects in detail today Many stressors Continues fluoxetine 20 mg daily

## 2021-08-08 NOTE — Assessment & Plan Note (Signed)
Reviewed stressors/ coping techniques/symptoms/ support sources/ tx options and side effects in detail today Many stressors Continues fluoxetine 20 mg daily

## 2021-08-08 NOTE — Assessment & Plan Note (Signed)
Rev dexa 10/2020 Continues ca and D One fall/no fx Disc fall prev Disc exercise effort

## 2021-08-17 ENCOUNTER — Encounter: Payer: Self-pay | Admitting: Family Medicine

## 2021-08-30 ENCOUNTER — Other Ambulatory Visit: Payer: Self-pay | Admitting: Family Medicine

## 2021-08-30 DIAGNOSIS — Z1231 Encounter for screening mammogram for malignant neoplasm of breast: Secondary | ICD-10-CM

## 2021-10-08 ENCOUNTER — Ambulatory Visit
Admission: RE | Admit: 2021-10-08 | Discharge: 2021-10-08 | Disposition: A | Discharge status: Home / Self Care | Payer: Medicare Other | Source: Ambulatory Visit | Attending: Family Medicine | Admitting: Family Medicine

## 2021-10-08 DIAGNOSIS — Z1231 Encounter for screening mammogram for malignant neoplasm of breast: Secondary | ICD-10-CM

## 2021-11-07 ENCOUNTER — Encounter: Payer: Self-pay | Admitting: Ophthalmology

## 2021-11-07 ENCOUNTER — Other Ambulatory Visit: Payer: Self-pay

## 2021-11-11 NOTE — Discharge Instructions (Signed)

## 2021-11-13 ENCOUNTER — Ambulatory Visit
Admission: RE | Admit: 2021-11-13 | Discharge: 2021-11-13 | Disposition: A | Discharge status: Home / Self Care | Payer: Medicare Other | Source: Ambulatory Visit | Attending: Ophthalmology | Admitting: Ophthalmology

## 2021-11-13 ENCOUNTER — Ambulatory Visit: Payer: Medicare Other | Admitting: Anesthesiology

## 2021-11-13 ENCOUNTER — Encounter
Admission: RE | Disposition: A | Discharge status: Home / Self Care | Payer: Self-pay | Source: Ambulatory Visit | Attending: Ophthalmology

## 2021-11-13 ENCOUNTER — Encounter: Payer: Self-pay | Admitting: Ophthalmology

## 2021-11-13 ENCOUNTER — Other Ambulatory Visit: Payer: Self-pay

## 2021-11-13 ENCOUNTER — Ambulatory Visit (AMBULATORY_SURGERY_CENTER): Payer: Medicare Other | Admitting: Anesthesiology

## 2021-11-13 DIAGNOSIS — H2511 Age-related nuclear cataract, right eye: Secondary | ICD-10-CM | POA: Diagnosis present

## 2021-11-13 DIAGNOSIS — F429 Obsessive-compulsive disorder, unspecified: Secondary | ICD-10-CM

## 2021-11-13 DIAGNOSIS — E785 Hyperlipidemia, unspecified: Secondary | ICD-10-CM

## 2021-11-13 DIAGNOSIS — F419 Anxiety disorder, unspecified: Secondary | ICD-10-CM

## 2021-11-13 HISTORY — DX: Other complications of anesthesia, initial encounter: T88.59XA

## 2021-11-13 HISTORY — PX: CATARACT EXTRACTION W/PHACO: SHX586

## 2021-11-13 HISTORY — DX: Other specified postprocedural states: Z98.890

## 2021-11-13 HISTORY — DX: Other specified postprocedural states: R11.2

## 2021-11-13 SURGERY — PHACOEMULSIFICATION, CATARACT, WITH IOL INSERTION
Anesthesia: Monitor Anesthesia Care | Site: Eye | Laterality: Right

## 2021-11-13 MED ORDER — TETRACAINE HCL 0.5 % OP SOLN
1.0000 [drp] | OPHTHALMIC | Status: DC | PRN
  Administered 2021-11-13 (×3): 1 [drp] via OPHTHALMIC

## 2021-11-13 MED ORDER — SIGHTPATH DOSE#1 BSS IO SOLN
INTRAOCULAR | Status: DC | PRN
  Administered 2021-11-13: 15 mL

## 2021-11-13 MED ORDER — MIDAZOLAM HCL 2 MG/2ML IJ SOLN
INTRAMUSCULAR | Status: DC | PRN
  Administered 2021-11-13: 1 mg via INTRAVENOUS

## 2021-11-13 MED ORDER — FENTANYL CITRATE (PF) 100 MCG/2ML IJ SOLN
INTRAMUSCULAR | Status: DC | PRN
  Administered 2021-11-13: 50 ug via INTRAVENOUS

## 2021-11-13 MED ORDER — BRIMONIDINE TARTRATE-TIMOLOL 0.2-0.5 % OP SOLN
OPHTHALMIC | Status: DC | PRN
  Administered 2021-11-13: 1 [drp] via OPHTHALMIC

## 2021-11-13 MED ORDER — NEOMYCIN-POLYMYXIN-DEXAMETH 3.5-10000-0.1 OP OINT
TOPICAL_OINTMENT | OPHTHALMIC | Status: DC | PRN
  Administered 2021-11-13: 1 via OPHTHALMIC

## 2021-11-13 MED ORDER — ARMC OPHTHALMIC DILATING DROPS
1.0000 | OPHTHALMIC | Status: DC | PRN
  Administered 2021-11-13 (×3): 1 via OPHTHALMIC

## 2021-11-13 MED ORDER — MOXIFLOXACIN HCL 0.5 % OP SOLN
OPHTHALMIC | Status: DC | PRN
  Administered 2021-11-13: 0.2 mL via OPHTHALMIC

## 2021-11-13 MED ORDER — SIGHTPATH DOSE#1 BSS IO SOLN
INTRAOCULAR | Status: DC | PRN
  Administered 2021-11-13: 65 mL via OPHTHALMIC

## 2021-11-13 MED ORDER — SIGHTPATH DOSE#1 BSS IO SOLN
INTRAOCULAR | Status: DC | PRN
  Administered 2021-11-13: 1 mL via INTRAMUSCULAR

## 2021-11-13 MED ORDER — LACTATED RINGERS IV SOLN: INTRAVENOUS | Status: DC

## 2021-11-13 MED ORDER — SIGHTPATH DOSE#1 NA HYALUR & NA CHOND-NA HYALUR IO KIT
PACK | INTRAOCULAR | Status: DC | PRN
  Administered 2021-11-13: 1 via OPHTHALMIC

## 2021-11-13 SURGICAL SUPPLY — 11 items
CATARACT SUITE SIGHTPATH (MISCELLANEOUS) ×1 IMPLANT
FEE CATARACT SUITE SIGHTPATH (MISCELLANEOUS) ×1 IMPLANT
GLOVE SRG 8 PF TXTR STRL LF DI (GLOVE) ×1 IMPLANT
GLOVE SURG ENC TEXT LTX SZ7.5 (GLOVE) ×1 IMPLANT
GLOVE SURG UNDER POLY LF SZ8 (GLOVE) ×1
LENS IOL DIU375 9.5 ×1 IMPLANT
LENS IOL EYHANCE TRC 375 9.5 IMPLANT
NDL FILTER BLUNT 18X1 1/2 (NEEDLE) ×1 IMPLANT
NEEDLE FILTER BLUNT 18X1 1/2 (NEEDLE) ×1 IMPLANT
SYR 3ML LL SCALE MARK (SYRINGE) ×1 IMPLANT
WATER STERILE IRR 250ML POUR (IV SOLUTION) ×1 IMPLANT

## 2021-11-13 NOTE — Op Note (Signed)
LOCATION:  [facility]   PREOPERATIVE DIAGNOSIS:  Nuclear sclerotic cataract of the right eye.  H25.11   POSTOPERATIVE DIAGNOSIS:  Nuclear sclerotic cataract of the right eye.   PROCEDURE:  Phacoemulsification with Toric posterior chamber intraocular lens placement of the right eye.  Ultrasound time: Procedure(s): CATARACT EXTRACTION PHACO AND INTRAOCULAR LENS PLACEMENT (IOC) RIGHT TORIC LENS 3.91 00:53.0 (Right)  LENS:   Implant Name Type Inv. Item Serial No. Manufacturer Lot No. LRB No. Used Action  LENS IOL [id number] 9.5 - [id number]  LENS IOL DIU375 9.5 [id number] [employer]  Right 1 Implanted     Toric intraocular lens with 3.75 diopters of cylindrical power with axis orientation at 108 degrees.   SURGEON:  Deirdre Evener, MD   ANESTHESIA: Topical with tetracaine drops and 2% Xylocaine jelly, augmented with 1% preservative-free intracameral lidocaine. .   COMPLICATIONS:  None.   DESCRIPTION OF PROCEDURE:  The patient was identified in the holding room and transported to the operating suite and placed in the supine position under the operating microscope.  The right eye was identified as the operative eye, and it was prepped and draped in the usual sterile ophthalmic fashion.    A clear-corneal paracentesis incision was made at the 12:00 position.  0.5 ml of preservative-free 1% lidocaine was injected into the anterior chamber. The anterior chamber was filled with Viscoat.  A 2.4 millimeter near clear corneal incision was then made at the 9:00 position.  A cystotome and capsulorrhexis forceps were then used to make a curvilinear capsulorrhexis.  Hydrodissection and hydrodelineation were then performed using balanced salt solution.   Phacoemulsification was then used in stop and chop fashion to remove the lens, nucleus and epinucleus.  The remaining cortex was aspirated using the irrigation and aspiration handpiece.  Provisc viscoelastic was then placed into the capsular  bag to distend it for lens placement.  The Verion digital marker was used to align the implant at the intended axis.   A Toric lens was then injected into the capsular bag.  It was rotated clockwise until the axis marks on the lens were approximately 15 degrees in the counterclockwise direction to the intended alignment.  The viscoelastic was aspirated from the eye using the irrigation aspiration handpiece.  Then, a Koch spatula through the sideport incision was used to rotate the lens in a clockwise direction until the axis markings of the intraocular lens were lined up with the Verion alignment.  Balanced salt solution was then used to hydrate the wounds. Vigamox 0.2 ml of a 1mg  per ml solution was injected into the anterior chamber for a dose of 0.2 mg of intracameral antibiotic at the completion of the case.    The eye was noted to have a physiologic pressure and there was no wound leak noted.   Timolol and Brimonidine drops were applied to the eye.  The patient was taken to the recovery room in stable condition having had no complications of anesthesia or surgery.  Maven Rosander 11/13/2021, 12:41 PM

## 2021-11-13 NOTE — H&P (Signed)
[facility]   Primary Care Physician:  Tower, Audrie Gallus, MD Ophthalmologist: Dr. Lockie Mola  Pre-Procedure History & Physical: HPI:  Donna Larson is a 66 y.o. female here for ophthalmic surgery.   Past Medical History:  Diagnosis Date   Allergy    seasonal   Complication of anesthesia    Hyperlipidemia    OCD (obsessive compulsive disorder)    PONV (postoperative nausea and vomiting)     Past Surgical History:  Procedure Laterality Date   ABDOMINAL HYSTERECTOMY  2000   Partial, fibroids   BREAST BIOPSY Left 2006   Negative   BREAST BIOPSY Left 2015   Negative   BREAST SURGERY Left 2006   COLONOSCOPY  12/2006   COLONOSCOPY WITH PROPOFOL N/A 12/24/2016   Procedure: COLONOSCOPY WITH PROPOFOL;  Surgeon: [name], MD;  Location: [facility] ENDOSCOPY;  Service: Endoscopy;  Laterality: N/A;   DOPPLER ECHOCARDIOGRAPHY  1997   Equivocal   LIPOSUCTION  2010   OOPHORECTOMY     TUBAL LIGATION  1992    Prior to Admission medications   Medication Sig Start Date End Date Taking? Authorizing Provider  Calcium Carbonate-Vitamin D (CALCIUM-VITAMIN D) 600-200 MG-UNIT CAPS Take 1 capsule by mouth 3 (three) times daily.    [provider]  Estradiol 10 MCG TABS vaginal tablet Place 20 mcg vaginally 2 (two) times a week. 02/01/07   [provider]  FLUoxetine (PROZAC) 20 MG tablet Take 20 mg by mouth daily.    [provider]  Nutritional Supplements (ESTROVEN PO) Take 1 tablet by mouth daily.    [provider]    Allergies as of 09/19/2021 - Review Complete 08/08/2021  Allergen Reaction Noted   Penicillins  10/09/2006   Shellfish allergy Nausea And Vomiting 05/11/2013   Sulfonamide derivatives  10/09/2006    Family History  Problem Relation Age of Onset   Arthritis Mother        osteoarthritis   Depression Mother    Hypertension Mother    Osteoporosis Mother    Hyperlipidemia Father    Heart disease Father         Bicuspid aortic valve - replaced; sick sinus syndrome   Heart disease Other        Heart valve repair   Cancer Other        1 had Breast CA ; 2 had colon CA   Breast cancer Other    Heart disease Paternal Grandfather        MI   Cancer Paternal Grandfather        Renal    Social History   Socioeconomic History   Marital status: Married    Spouse name: Not on file   Number of children: 0   Years of education: Not on file   Highest education level: Not on file  Occupational History   Occupation: Social work    Associate Professor: [employer]  Tobacco Use   Smoking status: Never   Smokeless tobacco: Never  Vaping Use   Vaping Use: Never used  Substance and Sexual Activity   Alcohol use: Yes    Alcohol/week: 14.0 standard drinks of alcohol    Types: 14 Glasses of wine per week    Comment: daily-wine/beer    Drug use: No   Sexual activity: Not on file  Other Topics Concern   Not on file  Social History Narrative   Regular exercise:  Walking and calesthenics   Occupation: Sports administrator for [employer]  Social Determinants of Health   Financial Resource Strain: Not on file  Food Insecurity: Not on file  Transportation Needs: Not on file  Physical Activity: Not on file  Stress: Not on file  Social Connections: Not on file  Intimate Partner Violence: Not on file    Review of Systems: See HPI, otherwise negative ROS  Physical Exam: Ht 5\' 7"  (1.702 m)   Wt 60.3 kg   BMI 20.83 kg/m  General:   Alert,  pleasant and cooperative in NAD Head:  Normocephalic and atraumatic. Lungs:  Clear to auscultation.    Heart:  Regular rate and rhythm.   Impression/Plan: is here for ophthalmic surgery.  Risks, benefits, limitations, and alternatives regarding ophthalmic surgery have been reviewed with the patient.  Questions have been answered.  All parties agreeable.   [name], MD  11/13/2021, 11:33 AM

## 2021-11-13 NOTE — Anesthesia Preprocedure Evaluation (Signed)
Anesthesia Evaluation  Patient identified by MRN, date of birth, ID band Patient awake    Reviewed: Allergy & Precautions, NPO status , Patient's Chart, lab work & pertinent test results  History of Anesthesia Complications (+) PONV and history of anesthetic complications  Airway Mallampati: II  TM Distance: >3 FB Neck ROM: Full    Dental no notable dental hx. (+) Teeth Intact   Pulmonary neg pulmonary ROS, neg sleep apnea, neg COPD, Patient abstained from smoking.Not current smoker,    Pulmonary exam normal breath sounds clear to auscultation       Cardiovascular Exercise Tolerance: Good METS(-) hypertension(-) CAD and (-) Past MI negative cardio ROS  (-) dysrhythmias  Rhythm:Regular Rate:Normal - Systolic murmurs    Neuro/Psych PSYCHIATRIC DISORDERS Anxiety negative neurological ROS     GI/Hepatic neg GERD  ,(+)     substance abuse  alcohol use, 1-2 glasses of wine nightly   Endo/Other  neg diabetes  Renal/GU negative Renal ROS     Musculoskeletal   Abdominal   Peds  Hematology   Anesthesia Other Findings Past Medical History: No date: Allergy     Comment:  seasonal No date: Complication of anesthesia No date: Hyperlipidemia No date: OCD (obsessive compulsive disorder) No date: PONV (postoperative nausea and vomiting)  Reproductive/Obstetrics                             Anesthesia Physical  Anesthesia Plan  ASA: 2  Anesthesia Plan: MAC   Post-op Pain Management: Minimal or no pain anticipated   Induction: Intravenous  PONV Risk Score and Plan: 3 and Midazolam  Airway Management Planned: Nasal Cannula  Additional Equipment:   Intra-op Plan:   Post-operative Plan:   Informed Consent: I have reviewed the patients History and Physical, chart, labs and discussed the procedure including the risks, benefits and alternatives for the proposed anesthesia with the patient  or authorized representative who has indicated his/her understanding and acceptance.       Plan Discussed with: CRNA and Surgeon  Anesthesia Plan Comments: (Explained risks of anesthesia, including PONV, and rare emergencies such as cardiac events, respiratory problems, and allergic reactions, requiring invasive intervention. Discussed the role of CRNA in patient's perioperative care. Patient understands. )        Anesthesia Quick Evaluation  

## 2021-11-13 NOTE — Anesthesia Postprocedure Evaluation (Signed)
Anesthesia Post Note  Patient: Donna Larson [facility]  Procedure(s) Performed: CATARACT EXTRACTION PHACO AND INTRAOCULAR LENS PLACEMENT (IOC) RIGHT TORIC LENS 3.91 00:53.0 (Right: Eye)     Patient location during evaluation: PACU Anesthesia Type: MAC Level of consciousness: awake and alert Pain management: pain level controlled Vital Signs Assessment: post-procedure vital signs reviewed and stable Respiratory status: spontaneous breathing, nonlabored ventilation, respiratory function stable and patient connected to nasal cannula oxygen Cardiovascular status: stable and blood pressure returned to baseline Postop Assessment: no apparent nausea or vomiting Anesthetic complications: no   There were no known notable events for this encounter.  Arita Miss

## 2021-11-13 NOTE — Transfer of Care (Signed)
Immediate Anesthesia Transfer of Care Note  Patient: Donna Larson [facility]  Procedure(s) Performed: CATARACT EXTRACTION PHACO AND INTRAOCULAR LENS PLACEMENT (IOC) RIGHT TORIC LENS 3.91 00:53.0 (Right: Eye)  Patient Location: PACU  Anesthesia Type: MAC  Level of Consciousness: awake, alert  and patient cooperative  Airway and Oxygen Therapy: Patient Spontanous Breathing and Patient connected to supplemental oxygen  Post-op Assessment: Post-op Vital signs reviewed, Patient's Cardiovascular Status Stable, Respiratory Function Stable, Patent Airway and No signs of Nausea or vomiting  Post-op Vital Signs: Reviewed and stable  Complications: There were no known notable events for this encounter.

## 2021-11-14 ENCOUNTER — Other Ambulatory Visit: Payer: Self-pay

## 2021-11-14 ENCOUNTER — Encounter: Payer: Self-pay | Admitting: Ophthalmology

## 2021-11-27 ENCOUNTER — Encounter
Admission: RE | Disposition: A | Discharge status: Home / Self Care | Payer: Self-pay | Source: Ambulatory Visit | Attending: Ophthalmology

## 2021-11-27 ENCOUNTER — Ambulatory Visit: Payer: Medicare Other | Admitting: Anesthesiology

## 2021-11-27 ENCOUNTER — Ambulatory Visit
Admission: RE | Admit: 2021-11-27 | Discharge: 2021-11-27 | Disposition: A | Discharge status: Home / Self Care | Payer: Medicare Other | Source: Ambulatory Visit | Attending: Ophthalmology | Admitting: Ophthalmology

## 2021-11-27 ENCOUNTER — Ambulatory Visit (AMBULATORY_SURGERY_CENTER): Payer: Medicare Other | Admitting: Anesthesiology

## 2021-11-27 ENCOUNTER — Encounter: Payer: Self-pay | Admitting: Ophthalmology

## 2021-11-27 ENCOUNTER — Other Ambulatory Visit: Payer: Self-pay

## 2021-11-27 DIAGNOSIS — H2512 Age-related nuclear cataract, left eye: Secondary | ICD-10-CM | POA: Diagnosis present

## 2021-11-27 DIAGNOSIS — F419 Anxiety disorder, unspecified: Secondary | ICD-10-CM

## 2021-11-27 HISTORY — PX: CATARACT EXTRACTION W/PHACO: SHX586

## 2021-11-27 SURGERY — PHACOEMULSIFICATION, CATARACT, WITH IOL INSERTION
Anesthesia: Monitor Anesthesia Care | Site: Eye | Laterality: Left

## 2021-11-27 MED ORDER — SIGHTPATH DOSE#1 BSS IO SOLN
INTRAOCULAR | Status: DC | PRN
  Administered 2021-11-27: 1 mL via INTRAMUSCULAR

## 2021-11-27 MED ORDER — ARMC OPHTHALMIC DILATING DROPS
1.0000 | OPHTHALMIC | Status: AC | PRN
  Administered 2021-11-27 (×3): 1 via OPHTHALMIC

## 2021-11-27 MED ORDER — TETRACAINE HCL 0.5 % OP SOLN
1.0000 [drp] | OPHTHALMIC | Status: AC | PRN
  Administered 2021-11-27 (×3): 1 [drp] via OPHTHALMIC

## 2021-11-27 MED ORDER — LACTATED RINGERS IV SOLN: INTRAVENOUS | Status: DC

## 2021-11-27 MED ORDER — MIDAZOLAM HCL 2 MG/2ML IJ SOLN
INTRAMUSCULAR | Status: DC | PRN
  Administered 2021-11-27: 1 mg via INTRAVENOUS

## 2021-11-27 MED ORDER — SIGHTPATH DOSE#1 NA HYALUR & NA CHOND-NA HYALUR IO KIT
PACK | INTRAOCULAR | Status: DC | PRN
  Administered 2021-11-27: 1 via OPHTHALMIC

## 2021-11-27 MED ORDER — SIGHTPATH DOSE#1 BSS IO SOLN
INTRAOCULAR | Status: DC | PRN
  Administered 2021-11-27: 15 mL

## 2021-11-27 MED ORDER — BRIMONIDINE TARTRATE-TIMOLOL 0.2-0.5 % OP SOLN
OPHTHALMIC | Status: DC | PRN
  Administered 2021-11-27: 1 [drp] via OPHTHALMIC

## 2021-11-27 MED ORDER — SIGHTPATH DOSE#1 BSS IO SOLN
INTRAOCULAR | Status: DC | PRN
  Administered 2021-11-27: 69 mL via OPHTHALMIC

## 2021-11-27 MED ORDER — MOXIFLOXACIN HCL 0.5 % OP SOLN
OPHTHALMIC | Status: DC | PRN
  Administered 2021-11-27: 0.2 mL via OPHTHALMIC

## 2021-11-27 MED ORDER — FENTANYL CITRATE (PF) 100 MCG/2ML IJ SOLN
INTRAMUSCULAR | Status: DC | PRN
  Administered 2021-11-27: 50 ug via INTRAVENOUS

## 2021-11-27 SURGICAL SUPPLY — 11 items
CATARACT SUITE SIGHTPATH (MISCELLANEOUS) ×1 IMPLANT
FEE CATARACT SUITE SIGHTPATH (MISCELLANEOUS) ×1 IMPLANT
GLOVE SRG 8 PF TXTR STRL LF DI (GLOVE) ×1 IMPLANT
GLOVE SURG ENC TEXT LTX SZ7.5 (GLOVE) ×1 IMPLANT
GLOVE SURG UNDER POLY LF SZ8 (GLOVE) ×1
LENS IOL DIU225 9.0 ×1 IMPLANT
LENS IOL EYHANCE TRC 225 9.0 IMPLANT
NDL FILTER BLUNT 18X1 1/2 (NEEDLE) ×1 IMPLANT
NEEDLE FILTER BLUNT 18X1 1/2 (NEEDLE) ×1 IMPLANT
SYR 3ML LL SCALE MARK (SYRINGE) ×1 IMPLANT
WATER STERILE IRR 250ML POUR (IV SOLUTION) ×1 IMPLANT

## 2021-11-27 NOTE — Anesthesia Preprocedure Evaluation (Signed)
Anesthesia Evaluation  Patient identified by MRN, date of birth, ID band Patient awake    Reviewed: Allergy & Precautions, NPO status , Patient's Chart, lab work & pertinent test results  History of Anesthesia Complications (+) PONV and history of anesthetic complications  Airway Mallampati: II  TM Distance: >3 FB Neck ROM: Full    Dental no notable dental hx. (+) Teeth Intact   Pulmonary neg pulmonary ROS, neg sleep apnea, neg COPD, Patient abstained from smoking.Not current smoker,    Pulmonary exam normal breath sounds clear to auscultation       Cardiovascular Exercise Tolerance: Good METS(-) hypertension(-) CAD and (-) Past MI negative cardio ROS  (-) dysrhythmias  Rhythm:Regular Rate:Normal - Systolic murmurs    Neuro/Psych PSYCHIATRIC DISORDERS Anxiety negative neurological ROS     GI/Hepatic neg GERD  ,(+)     substance abuse  alcohol use, 1-2 glasses of wine nightly   Endo/Other  neg diabetes  Renal/GU negative Renal ROS     Musculoskeletal   Abdominal   Peds  Hematology   Anesthesia Other Findings Past Medical History: No date: Allergy     Comment:  seasonal No date: Complication of anesthesia No date: Hyperlipidemia No date: OCD (obsessive compulsive disorder) No date: PONV (postoperative nausea and vomiting)  Reproductive/Obstetrics                             Anesthesia Physical  Anesthesia Plan  ASA: 2  Anesthesia Plan: MAC   Post-op Pain Management: Minimal or no pain anticipated   Induction: Intravenous  PONV Risk Score and Plan: 3 and Midazolam  Airway Management Planned: Nasal Cannula  Additional Equipment:   Intra-op Plan:   Post-operative Plan:   Informed Consent: I have reviewed the patients History and Physical, chart, labs and discussed the procedure including the risks, benefits and alternatives for the proposed anesthesia with the patient  or authorized representative who has indicated his/her understanding and acceptance.       Plan Discussed with: CRNA and Surgeon  Anesthesia Plan Comments: (Explained risks of anesthesia, including PONV, and rare emergencies such as cardiac events, respiratory problems, and allergic reactions, requiring invasive intervention. Discussed the role of CRNA in patient's perioperative care. Patient understands. )        Anesthesia Quick Evaluation

## 2021-11-27 NOTE — H&P (Signed)
[facility]   Primary Care Physician:  Tower, Wynelle Fanny, MD Ophthalmologist: Dr. Leandrew Koyanagi  Pre-Procedure History & Physical: HPI:  Donna Larson is a 66 y.o. female here for ophthalmic surgery.   Past Medical History:  Diagnosis Date   Allergy    seasonal   Complication of anesthesia    Hyperlipidemia    OCD (obsessive compulsive disorder)    PONV (postoperative nausea and vomiting)     Past Surgical History:  Procedure Laterality Date   ABDOMINAL HYSTERECTOMY  2000   Partial, fibroids   BREAST BIOPSY Left 2006   Negative   BREAST BIOPSY Left 2015   Negative   BREAST SURGERY Left 2006   CATARACT EXTRACTION W/PHACO Right 11/13/2021   Procedure: CATARACT EXTRACTION PHACO AND INTRAOCULAR LENS PLACEMENT ([place]) RIGHT TORIC LENS 3.91 00:53.0;  Surgeon: Leandrew Koyanagi, MD;  Location: [facility];  Service: Ophthalmology;  Laterality: Right;   COLONOSCOPY  12/2006   COLONOSCOPY WITH PROPOFOL N/A 12/24/2016   Procedure: COLONOSCOPY WITH PROPOFOL;  Surgeon: Robert Bellow, MD;  Location: [facility];  Service: Endoscopy;  Laterality: N/A;   DOPPLER ECHOCARDIOGRAPHY  1997   Equivocal   LIPOSUCTION  2010   OOPHORECTOMY     TUBAL LIGATION  1992    Prior to Admission medications   Medication Sig Start Date End Date Taking? Authorizing Provider  Calcium Carbonate-Vitamin D (CALCIUM-VITAMIN D) 600-200 MG-UNIT CAPS Take 1 capsule by mouth 3 (three) times daily.   Yes [provider]  Estradiol 10 MCG TABS vaginal tablet Place 20 mcg vaginally 2 (two) times a week. 02/01/07  Yes [provider]  FLUoxetine (PROZAC) 20 MG tablet Take 20 mg by mouth daily.   Yes [provider]  Nutritional Supplements (ESTROVEN PO) Take 1 tablet by mouth daily.   Yes [provider]    Allergies as of 09/19/2021 - Review Complete 08/08/2021  Allergen Reaction Noted   Penicillins  10/09/2006   Shellfish allergy Nausea And  Vomiting 05/11/2013   Sulfonamide derivatives  10/09/2006    Family History  Problem Relation Age of Onset   Arthritis Mother        osteoarthritis   Depression Mother    Hypertension Mother    Osteoporosis Mother    Hyperlipidemia Father    Heart disease Father        Bicuspid aortic valve - replaced; sick sinus syndrome   Heart disease Other        Heart valve repair   Cancer Other        1 had Breast CA ; 2 had colon CA   Breast cancer Other    Heart disease Paternal Grandfather        MI   Cancer Paternal Grandfather        Renal    Social History   Socioeconomic History   Marital status: Married    Spouse name: Not on file   Number of children: 0   Years of education: Not on file   Highest education level: Not on file  Occupational History   Occupation: Social work    Fish farm manager: [employer]  Tobacco Use   Smoking status: Never   Smokeless tobacco: Never  Vaping Use   Vaping Use: Never used  Substance and Sexual Activity   Alcohol use: Yes    Alcohol/week: 14.0 standard drinks of alcohol    Types: 14 Glasses of wine per week    Comment: daily-wine/beer  Drug use: No   Sexual activity: Not on file  Other Topics Concern   Not on file  Social History Narrative   Regular exercise:  Walking and calesthenics   Occupation: Sports administrator for [employer]    Social Determinants of Health   Financial Resource Strain: Not on file  Food Insecurity: Not on file  Transportation Needs: Not on file  Physical Activity: Not on file  Stress: Not on file  Social Connections: Not on file  Intimate Partner Violence: Not on file    Review of Systems: See HPI, otherwise negative ROS  Physical Exam: BP 135/78   Pulse 66   Temp (!) 97.2 F (36.2 C) (Temporal)   Resp 18   Ht 5\' 7"  (1.702 m)   Wt 59.4 kg   SpO2 100%   BMI 20.52 kg/m  General:   Alert,  pleasant and cooperative in NAD Head:  Normocephalic and atraumatic. Lungs:  Clear to auscultation.    Heart:   Regular rate and rhythm.   Impression/Plan: is here for ophthalmic surgery.  Risks, benefits, limitations, and alternatives regarding ophthalmic surgery have been reviewed with the patient.  Questions have been answered.  All parties agreeable.   Donna Highland, MD  11/27/2021, 12:27 PM

## 2021-11-27 NOTE — Discharge Instructions (Signed)

## 2021-11-27 NOTE — Anesthesia Postprocedure Evaluation (Signed)
Anesthesia Post Note  Patient: Donna Larson [facility]  Procedure(s) Performed: CATARACT EXTRACTION PHACO AND INTRAOCULAR LENS PLACEMENT ([facility]) LEFT TORIC LENS 4.23 00:55.4 (Left: Eye)     Patient location during evaluation: PACU Anesthesia Type: MAC Level of consciousness: awake and alert Pain management: pain level controlled Vital Signs Assessment: post-procedure vital signs reviewed and stable Respiratory status: spontaneous breathing, nonlabored ventilation, respiratory function stable and patient connected to nasal cannula oxygen Cardiovascular status: stable and blood pressure returned to baseline Postop Assessment: no apparent nausea or vomiting Anesthetic complications: no   There were no known notable events for this encounter.  Dimas Millin

## 2021-11-27 NOTE — Transfer of Care (Signed)
Immediate Anesthesia Transfer of Care Note  Patient: Donna Larson [facility]  Procedure(s) Performed: CATARACT EXTRACTION PHACO AND INTRAOCULAR LENS PLACEMENT ([facility]) LEFT TORIC LENS 4.23 00:55.4 (Left: Eye)  Patient Location: PACU  Anesthesia Type: MAC  Level of Consciousness: awake, alert  and patient cooperative  Airway and Oxygen Therapy: Patient Spontanous Breathing and Patient connected to supplemental oxygen  Post-op Assessment: Post-op Vital signs reviewed, Patient's Cardiovascular Status Stable, Respiratory Function Stable, Patent Airway and No signs of Nausea or vomiting  Post-op Vital Signs: Reviewed and stable  Complications: There were no known notable events for this encounter.

## 2021-11-27 NOTE — Op Note (Signed)
  LOCATION:  [facility]   PREOPERATIVE DIAGNOSIS:  Nuclear sclerotic cataract of the left eye.  H25.12  POSTOPERATIVE DIAGNOSIS:  Nuclear sclerotic cataract of the left eye.   PROCEDURE:  Phacoemulsification with Toric posterior chamber intraocular lens placement of the left eye.  Ultrasound time: Procedure(s): CATARACT EXTRACTION PHACO AND INTRAOCULAR LENS PLACEMENT ([facility]) LEFT TORIC LENS 4.23 00:55.4 (Left)  LENS:   Implant Name Type Inv. Item Serial No. Manufacturer Lot No. LRB No. Used Action  LENS IOL [id number] 9.0 - [id number]  LENS IOL [id number] 9.0 [id number] [employer]  Left 1 Implanted     Toric intraocular lens with 2.25 diopters of cylindrical power with axis orientation at 96 degrees.     SURGEON:  Wyonia Hough, MD   ANESTHESIA:  Topical with tetracaine drops and 2% Xylocaine jelly, augmented with 1% preservative-free intracameral lidocaine.  COMPLICATIONS:  None.   DESCRIPTION OF PROCEDURE:  The patient was identified in the holding room and transported to the operating suite and placed in the supine position under the operating microscope.  The left eye was identified as the operative eye, and it was prepped and draped in the usual sterile ophthalmic fashion.    A clear-corneal paracentesis incision was made at the 1:30 position.  0.5 ml of preservative-free 1% lidocaine was injected into the anterior chamber. The anterior chamber was filled with Viscoat.  A 2.4 millimeter near clear corneal incision was then made at the 10:30 position.  A cystotome and capsulorrhexis forceps were then used to make a curvilinear capsulorrhexis.  Hydrodissection and hydrodelineation were then performed using balanced salt solution.   Phacoemulsification was then used in stop and chop fashion to remove the lens, nucleus and epinucleus.  The remaining cortex was aspirated using the irrigation and aspiration handpiece.  Provisc viscoelastic was then placed into the capsular bag to  distend it for lens placement.  The Verion digital marker was used to align the implant at the intended axis.   A Toric lens was then injected into the capsular bag.  It was rotated clockwise until the axis marks on the lens were approximately 15 degrees in the counterclockwise direction to the intended alignment.  The viscoelastic was aspirated from the eye using the irrigation aspiration handpiece.  Then, a Koch spatula through the sideport incision was used to rotate the lens in a clockwise direction until the axis markings of the intraocular lens were lined up with the Verion alignment.  Balanced salt solution was then used to hydrate the wounds. Vigamox 0.2 ml of a 1mg  per ml solution was injected into the anterior chamber for a dose of 0.2 mg of intracameral antibiotic at the completion of the case.    The eye was noted to have a physiologic pressure and there was no wound leak noted.   Timolol and Brimonidine drops were applied to the eye.  The patient was taken to the recovery room in stable condition having had no complications of anesthesia or surgery.  Winni Ehrhard 11/27/2021, 1:17 PM

## 2021-11-28 ENCOUNTER — Encounter: Payer: Self-pay | Admitting: Ophthalmology

## 2021-11-29 ENCOUNTER — Ambulatory Visit (INDEPENDENT_AMBULATORY_CARE_PROVIDER_SITE_OTHER): Payer: Medicare Other

## 2021-11-29 DIAGNOSIS — Z23 Encounter for immunization: Secondary | ICD-10-CM | POA: Diagnosis not present

## 2022-08-02 ENCOUNTER — Telehealth: Payer: Self-pay | Admitting: Family Medicine

## 2022-08-02 DIAGNOSIS — R7309 Other abnormal glucose: Secondary | ICD-10-CM

## 2022-08-02 DIAGNOSIS — Z0000 Encounter for general adult medical examination without abnormal findings: Secondary | ICD-10-CM

## 2022-08-02 NOTE — Telephone Encounter (Signed)
-----   Message from Alvina Chou sent at 07/21/2022 11:43 AM EDT ----- Regarding: Lab orders for Monday, 6.3.24 Patient is scheduled for CPX labs, please order future labs, Thanks , Camelia Eng

## 2022-08-02 NOTE — Telephone Encounter (Signed)
Please let her know what her medicare blocked me from adding cbc to her labs  If she wants me to re order and sign a waiver I can    Medicare does not generally cover physical labs and we don't have another diagnosis code to use

## 2022-08-04 ENCOUNTER — Other Ambulatory Visit (INDEPENDENT_AMBULATORY_CARE_PROVIDER_SITE_OTHER): Payer: Medicare Other

## 2022-08-04 DIAGNOSIS — Z0000 Encounter for general adult medical examination without abnormal findings: Secondary | ICD-10-CM

## 2022-08-04 DIAGNOSIS — R7309 Other abnormal glucose: Secondary | ICD-10-CM

## 2022-08-04 DIAGNOSIS — M8580 Other specified disorders of bone density and structure, unspecified site: Secondary | ICD-10-CM

## 2022-08-04 LAB — COMPREHENSIVE METABOLIC PANEL
ALT: 20 U/L (ref 0–35)
AST: 20 U/L (ref 0–37)
Albumin: 3.9 g/dL (ref 3.5–5.2)
Alkaline Phosphatase: 58 U/L (ref 39–117)
BUN: 21 mg/dL (ref 6–23)
CO2: 28 mEq/L (ref 19–32)
Calcium: 8.8 mg/dL (ref 8.4–10.5)
Chloride: 104 mEq/L (ref 96–112)
Creatinine, Ser: 0.85 mg/dL (ref 0.40–1.20)
GFR: 71.1 mL/min (ref >60.00)
Glucose, Bld: 97 mg/dL (ref 70–99)
Potassium: 4.6 mEq/L (ref 3.5–5.1)
Sodium: 139 mEq/L (ref 135–145)
Total Bilirubin: 0.4 mg/dL (ref 0.2–1.2)
Total Protein: 5.9 g/dL — ABNORMAL LOW (ref 6.0–8.3)

## 2022-08-04 LAB — LIPID PANEL
Cholesterol: 178 mg/dL (ref 0–200)
HDL: 75.7 mg/dL (ref >39.00)
LDL Cholesterol: 92 mg/dL (ref 0–99)
NonHDL: 102.33
Total CHOL/HDL Ratio: 2
Triglycerides: 52 mg/dL (ref 0.0–149.0)
VLDL: 10.4 mg/dL (ref 0.0–40.0)

## 2022-08-04 LAB — CBC WITH DIFFERENTIAL/PLATELET
Basophils Absolute: 0.1 10*3/uL (ref 0.0–0.1)
Basophils Relative: 1.2 % (ref 0.0–3.0)
Eosinophils Absolute: 0.3 10*3/uL (ref 0.0–0.7)
Eosinophils Relative: 4.8 % (ref 0.0–5.0)
HCT: 40.2 % (ref 36.0–46.0)
Hemoglobin: 13.2 g/dL (ref 12.0–15.0)
Lymphocytes Relative: 35.7 % (ref 12.0–46.0)
Lymphs Abs: 2 10*3/uL (ref 0.7–4.0)
MCHC: 32.9 g/dL (ref 30.0–36.0)
MCV: 95.4 fl (ref 78.0–100.0)
Monocytes Absolute: 0.4 10*3/uL (ref 0.1–1.0)
Monocytes Relative: 7.4 % (ref 3.0–12.0)
Neutro Abs: 2.8 10*3/uL (ref 1.4–7.7)
Neutrophils Relative %: 50.9 % (ref 43.0–77.0)
Platelets: 239 10*3/uL (ref 150.0–400.0)
RBC: 4.22 Mil/uL (ref 3.87–5.11)
RDW: 12.5 % (ref 11.5–15.5)
WBC: 5.5 10*3/uL (ref 4.0–10.5)

## 2022-08-04 LAB — HEMOGLOBIN A1C: Hgb A1c MFr Bld: 5.4 % (ref 4.6–6.5)

## 2022-08-04 LAB — TSH: TSH: 2.5 u[IU]/mL (ref 0.35–5.50)

## 2022-08-04 NOTE — Addendum Note (Signed)
Addended by: Alvina Chou on: 08/04/2022 10:22 AM   Modules accepted: Orders

## 2022-08-11 ENCOUNTER — Ambulatory Visit (INDEPENDENT_AMBULATORY_CARE_PROVIDER_SITE_OTHER): Payer: Medicare Other | Admitting: Family Medicine

## 2022-08-11 ENCOUNTER — Encounter: Payer: Self-pay | Admitting: Family Medicine

## 2022-08-11 VITALS — BP 96/58 | HR 70 | Temp 98.0°F | Ht 66.5 in | Wt 129.0 lb

## 2022-08-11 DIAGNOSIS — M8580 Other specified disorders of bone density and structure, unspecified site: Secondary | ICD-10-CM | POA: Diagnosis not present

## 2022-08-11 DIAGNOSIS — F429 Obsessive-compulsive disorder, unspecified: Secondary | ICD-10-CM | POA: Diagnosis not present

## 2022-08-11 DIAGNOSIS — Z0000 Encounter for general adult medical examination without abnormal findings: Secondary | ICD-10-CM

## 2022-08-11 DIAGNOSIS — R7309 Other abnormal glucose: Secondary | ICD-10-CM | POA: Diagnosis not present

## 2022-08-11 DIAGNOSIS — Z1231 Encounter for screening mammogram for malignant neoplasm of breast: Secondary | ICD-10-CM

## 2022-08-11 DIAGNOSIS — E2839 Other primary ovarian failure: Secondary | ICD-10-CM

## 2022-08-11 NOTE — Patient Instructions (Addendum)
Add some strength training to your routine, this is important for bone and brain health and can reduce your risk of falls and help your body use insulin properly and regulate weight  Light weights, exercise bands , and internet videos are a good way to start  Yoga (chair or regular), machines , floor exercises or a gym with machines are also good options   Keep taking care of yourself     You have an order for:  []   2D Mammogram  [x]   3D Mammogram  [x]   Bone Density     Please call for appointment:   [x]   [facility]  [address] [place] [address]  [phone]  []   [facility])   [address]  [place], [place] [address]  [phone]  []   [facility]      [address] [place], [place], [place]        [phone]         []   [facility]  [address] [place], [place]  [phone]  []  [facility]   [address]   [place], [place] [address]  [phone]  []  [facility]  [address] [place], [place] [address] [phone]    Make sure to wear two piece clothing  No lotions powders or deodorants the day of the appointment Make sure to bring picture ID and insurance card.  Bring list of medications you are currently taking including any supplements.   Schedule your screening mammogram through MyChart!   Select [place] imaging sites can now be scheduled through MyChart.  Log into your MyChart account.  Go to 'Visit' (or 'Appointments' if  on mobile App) --> Schedule an  Appointment  Under 'Select a Reason for Visit' choose the Mammogram  Screening option.  Complete the pre-visit questions  and select the time and place that  best fits your schedule

## 2022-08-11 NOTE — Progress Notes (Signed)
Subjective:    Patient ID: Donna Larson, female    DOB: 11-Jul-1955, 67 y.o.   MRN: [id number]  HPI  Here for health maintenance exam and to review chronic medical problems   Wt Readings from Last 3 Encounters:  08/11/22 129 lb (58.5 kg)  11/27/21 131 lb (59.4 kg)  11/13/21 131 lb (59.4 kg)   20.51 kg/m  Vitals:   08/11/22 0852  BP: (!) 96/58  Pulse: 70  Temp: 98 F (36.7 C)  SpO2: 97%   Had cataract surgery in the past year   Working   Is planning a trip to [place] / [place] - excited about that   Immunization History  Administered Date(s) Administered   Fluad Quad(high Dose 65+) 12/21/2020, 11/29/2021   Hepatitis A 04/23/2001, 11/23/2001   IPV 11/29/2002   Influenza Split 12/16/2010   Influenza Whole 02/16/2004   Influenza,inj,Quad PF,6+ Mos 12/07/2013, 12/14/2014, 12/11/2016, 11/26/2019   Influenza-Unspecified 11/22/2012, 12/12/2015, 11/18/2017, 11/21/2018   PFIZER(Purple Top)SARS-COV-2 Vaccination 03/22/2019, 04/11/2019, 01/02/2020   PNEUMOCOCCAL CONJUGATE-20 08/07/2020   Td 11/02/2002   Tdap 02/01/2012, 08/17/2021   Typhoid Inactivated 04/23/2001   Zoster Recombinat (Shingrix) 08/10/2019, 10/25/2019    There are no preventive care reminders to display for this patient.  Mammogram 10/2021 Self breast exam: no lumps   Sees gyn - no longer paps / just exam   Colonoscopy 12/2016 with 10 y recall    Dexa 10/2020 mild osteopenia - due in aug  Falls: none Fractures: none  Supplements - ca and D  Exercise - walking /treadmill  Sit ups /stretch Working with some weights    Mood H/o OCD takes fluoxetine     08/11/2022    8:55 AM 08/08/2021    9:04 AM 08/07/2020    8:23 AM 07/29/2019    9:11 AM 07/27/2018    8:48 AM  Depression screen PHQ 2/9  Decreased Interest 0 0 0 0 0  Down, Depressed, Hopeless 0 0 0 0 0  PHQ - 2 Score 0 0 0 0 0  Altered sleeping 1   1   Tired, decreased energy 0   0   Change in appetite 0   0   Feeling bad or failure about  yourself  0   0   Trouble concentrating 0   0   Moving slowly or fidgety/restless 0   0   Suicidal thoughts 0   0   PHQ-9 Score 1   1   Difficult doing work/chores Not difficult at all   Not difficult at all   Job stress is decreased  Retirement planned in nov  Glucose Lab Results  Component Value Date   HGBA1C 5.4 08/04/2022    Cholesterol Lab Results  Component Value Date   CHOL 178 08/04/2022   CHOL 206 (H) 08/01/2021   CHOL 202 (H) 08/01/2020   Lab Results  Component Value Date   HDL 75.70 08/04/2022   HDL 88.70 08/01/2021   HDL 68.40 08/01/2020   Lab Results  Component Value Date   LDLCALC 92 08/04/2022   LDLCALC 108 (H) 08/01/2021   LDLCALC 122 (H) 08/01/2020   Lab Results  Component Value Date   TRIG 52.0 08/04/2022   TRIG 45.0 08/01/2021   TRIG 56.0 08/01/2020   Lab Results  Component Value Date   CHOLHDL 2 08/04/2022   CHOLHDL 2 08/01/2021   CHOLHDL 3 08/01/2020   Lab Results  Component Value Date   LDLDIRECT 109.0 10/30/2008   CMP  Component Value Date/Time   NA 139 08/04/2022 0733   K 4.6 08/04/2022 0733   CL 104 08/04/2022 0733   CO2 28 08/04/2022 0733   GLUCOSE 97 08/04/2022 0733   BUN 21 08/04/2022 0733   CREATININE 0.85 08/04/2022 0733   CALCIUM 8.8 08/04/2022 0733   PROT 5.9 (L) 08/04/2022 0733   ALBUMIN 3.9 08/04/2022 0733   AST 20 08/04/2022 0733   ALT 20 08/04/2022 0733   ALKPHOS 58 08/04/2022 0733   BILITOT 0.4 08/04/2022 0733   GFR 71.10 08/04/2022 0733   GFRNONAA 79.67 10/30/2008 0953   Lab Results  Component Value Date   TSH 2.50 08/04/2022   Lab Results  Component Value Date   WBC 5.5 08/04/2022   HGB 13.2 08/04/2022   HCT 40.2 08/04/2022   MCV 95.4 08/04/2022   PLT 239.0 08/04/2022    Patient Active Problem List   Diagnosis Date Noted   Osteopenia 10/07/2020   Welcome to Medicare preventive visit 08/07/2020   Estrogen deficiency 08/07/2020   Increased glucose level 07/27/2018   Encounter for screening  mammogram for breast cancer 02/17/2011   Routine general medical examination at a health care facility 12/05/2010   Obsessive-compulsive disorder 10/09/2006   ALLERGIC RHINITIS 10/09/2006   DERMATITIS, CONTACT, NOS 10/09/2006   Past Medical History:  Diagnosis Date   Allergy    seasonal   Anxiety 11/2017   Marked increase in work stress, chronic and current   Complication of anesthesia    Hyperlipidemia    OCD (obsessive compulsive disorder)    PONV (postoperative nausea and vomiting)    Past Surgical History:  Procedure Laterality Date   ABDOMINAL HYSTERECTOMY  2000   Partial, fibroids   BREAST BIOPSY Left 2006   Negative   BREAST BIOPSY Left 2015   Negative   BREAST SURGERY Left 2006   CATARACT EXTRACTION W/PHACO Right 11/13/2021   Procedure: CATARACT EXTRACTION PHACO AND INTRAOCULAR LENS PLACEMENT (IOC) RIGHT TORIC LENS 3.91 00:53.0;  Surgeon: Lockie Mola, MD;  Location: [facility];  Service: Ophthalmology;  Laterality: Right;   CATARACT EXTRACTION W/PHACO Left 11/27/2021   Procedure: CATARACT EXTRACTION PHACO AND INTRAOCULAR LENS PLACEMENT (IOC) LEFT TORIC LENS 4.23 00:55.4;  Surgeon: Lockie Mola, MD;  Location: [facility];  Service: Ophthalmology;  Laterality: Left;   COLONOSCOPY  12/2006   COLONOSCOPY WITH PROPOFOL N/A 12/24/2016   Procedure: COLONOSCOPY WITH PROPOFOL;  Surgeon: [name], MD;  Location: [facility];  Service: Endoscopy;  Laterality: N/A;   COSMETIC SURGERY  2010, 2020   Liposuction, Blepharoplasty and ptosis repair   DOPPLER ECHOCARDIOGRAPHY  1997   Equivocal   EYE SURGERY  2023   Cataract removal   LIPOSUCTION  2010   OOPHORECTOMY     TUBAL LIGATION  1992   Social History   Tobacco Use   Smoking status: Never   Smokeless tobacco: Never  Vaping Use   Vaping Use: Never used  Substance Use Topics   Alcohol use: Yes    Alcohol/week: 11.0 standard drinks of alcohol    Types: 6 Glasses of wine, 5  Cans of beer per week    Comment: Average 1 1/2 drinks/day   Drug use: No   Family History  Problem Relation Age of Onset   Arthritis Mother        osteoarthritis   Depression Mother    Hypertension Mother    Osteoporosis Mother    Hyperlipidemia Father    Heart disease Father  Bicuspid aortic valve - replaced; sick sinus syndrome   Obesity Father    Heart disease Other        Heart valve repair   Cancer Other        1 had Breast CA ; 2 had colon CA   Breast cancer Other    Heart disease Paternal Grandfather        MI   Cancer Paternal Grandfather        Renal   Obesity Paternal Grandfather    Stroke Maternal Grandmother    Varicose Veins Maternal Grandmother    Heart disease Paternal Grandmother    Obesity Paternal Grandmother    Diabetes Paternal Uncle    Obesity Paternal Uncle    Allergies  Allergen Reactions   Penicillins     REACTION: reaction not known  Family history so allergist told her to never take it   Shellfish Allergy Nausea And Vomiting   Sulfonamide Derivatives     REACTION: reaction not known  Family history of allergy and allergist told her not to take it   Current Outpatient Medications on File Prior to Visit  Medication Sig Dispense Refill   Calcium Carbonate-Vitamin D (CALCIUM-VITAMIN D) 600-200 MG-UNIT CAPS Take 1 capsule by mouth 3 (three) times daily.     Estradiol 10 MCG TABS vaginal tablet Place 20 mcg vaginally 2 (two) times a week.     FLUoxetine (PROZAC) 20 MG tablet Take 20 mg by mouth daily.     Nutritional Supplements (ESTROVEN PO) Take 1 tablet by mouth daily.     No current facility-administered medications on file prior to visit.    Review of Systems  Constitutional:  Negative for activity change, appetite change, fatigue, fever and unexpected weight change.  HENT:  Negative for congestion, ear pain, rhinorrhea, sinus pressure and sore throat.   Eyes:  Negative for pain, redness and visual disturbance.  Respiratory:   Negative for cough, shortness of breath and wheezing.   Cardiovascular:  Negative for chest pain and palpitations.  Gastrointestinal:  Negative for abdominal pain, blood in stool, constipation and diarrhea.  Endocrine: Negative for polydipsia and polyuria.  Genitourinary:  Negative for dysuria, frequency and urgency.  Musculoskeletal:  Negative for arthralgias, back pain and myalgias.  Skin:  Negative for pallor and rash.  Allergic/Immunologic: Negative for environmental allergies.  Neurological:  Negative for dizziness, syncope and headaches.  Hematological:  Negative for adenopathy. Does not bruise/bleed easily.  Psychiatric/Behavioral:  Negative for decreased concentration and dysphoric mood. The patient is not nervous/anxious.        Objective:   Physical Exam Constitutional:      General: She is not in acute distress.    Appearance: Normal appearance. She is well-developed and normal weight. She is not ill-appearing or diaphoretic.  HENT:     Head: Normocephalic and atraumatic.     Right Ear: Tympanic membrane, ear canal and external ear normal.     Left Ear: Tympanic membrane, ear canal and external ear normal.     Nose: Nose normal. No congestion.     Mouth/Throat:     Mouth: Mucous membranes are moist.     Pharynx: Oropharynx is clear. No posterior oropharyngeal erythema.  Eyes:     General: No scleral icterus.    Extraocular Movements: Extraocular movements intact.     Conjunctiva/sclera: Conjunctivae normal.     Pupils: Pupils are equal, round, and reactive to light.  Neck:     Thyroid: No thyromegaly.  Vascular: No carotid bruit or JVD.  Cardiovascular:     Rate and Rhythm: Normal rate and regular rhythm.     Pulses: Normal pulses.     Heart sounds: Normal heart sounds.     No gallop.  Pulmonary:     Effort: Pulmonary effort is normal. No respiratory distress.     Breath sounds: Normal breath sounds. No wheezing.     Comments: Good air exch Chest:     Chest  wall: No tenderness.  Abdominal:     General: Bowel sounds are normal. There is no distension or abdominal bruit.     Palpations: Abdomen is soft. There is no mass.     Tenderness: There is no abdominal tenderness.     Hernia: No hernia is present.  Genitourinary:    Comments: Sees gyn for breast and pelvic exam Musculoskeletal:        General: No tenderness. Normal range of motion.     Cervical back: Normal range of motion and neck supple. No rigidity. No muscular tenderness.     Right lower leg: No edema.     Left lower leg: No edema.     Comments: No kyphosis   Lymphadenopathy:     Cervical: No cervical adenopathy.  Skin:    General: Skin is warm and dry.     Coloration: Skin is not pale.     Findings: No erythema or rash.     Comments: Solar lentigines diffusely   Neurological:     Mental Status: She is alert. Mental status is at baseline.     Cranial Nerves: No cranial nerve deficit.     Motor: No abnormal muscle tone.     Coordination: Coordination normal.     Gait: Gait normal.     Deep Tendon Reflexes: Reflexes are normal and symmetric. Reflexes normal.  Psychiatric:        Mood and Affect: Mood normal.        Cognition and Memory: Cognition and memory normal.           Assessment & Plan:   Problem List Items Addressed This Visit       Musculoskeletal and Integument   Osteopenia    Dexa ordered for  aug  No falls or fracture Good exercise/ taking ca and D  Disc need for calcium/ vitamin D/ wt bearing exercise and bone density test every 2 y to monitor Disc safety/ fracture risk in detail          Other   Routine general medical examination at a health care facility - Primary    Reviewed health habits including diet and exercise and skin cancer prevention Reviewed appropriate screening tests for age  Also reviewed health mt list, fam hx and immunization status , as well as social and family history   See HPI Labs reviewed and ordered Sees gyn   Mammogram 10/2021- ordered for aug  Dexa ordered, no falls or fractures Taking ca and D Encouraged more strength training  PHQ is 1 (due to sleep issues)  Encouraged sun protection       Obsessive-compulsive disorder    Stable with fluoxetine 20 mg daily  Mood is good Less stress recently       Increased glucose level    Lab Results  Component Value Date   HGBA1C 5.4 08/04/2022  disc imp of low glycemic diet and wt loss to prevent DM2       Estrogen deficiency    Dexa  ordered for aug 2024      Relevant Orders   DG Bone Density   Encounter for screening mammogram for breast cancer    Mammogram ordered for aug 2024      Relevant Orders   MM 3D SCREENING MAMMOGRAM BILATERAL BREAST

## 2022-08-11 NOTE — Assessment & Plan Note (Signed)
Reviewed health habits including diet and exercise and skin cancer prevention Reviewed appropriate screening tests for age  Also reviewed health mt list, fam hx and immunization status , as well as social and family history   See HPI Labs reviewed and ordered Sees gyn  Mammogram 10/2021- ordered for aug  Dexa ordered, no falls or fractures Taking ca and D Encouraged more strength training  PHQ is 1 (due to sleep issues)  Encouraged sun protection

## 2022-08-11 NOTE — Assessment & Plan Note (Signed)
Dexa ordered for aug 2024

## 2022-08-11 NOTE — Assessment & Plan Note (Signed)
Dexa ordered for  aug  No falls or fracture Good exercise/ taking ca and D  Disc need for calcium/ vitamin D/ wt bearing exercise and bone density test every 2 y to monitor Disc safety/ fracture risk in detail

## 2022-08-11 NOTE — Assessment & Plan Note (Signed)
Mammogram ordered for aug 2024

## 2022-08-11 NOTE — Assessment & Plan Note (Signed)
Stable with fluoxetine 20 mg daily  Mood is good Less stress recently

## 2022-08-11 NOTE — Assessment & Plan Note (Signed)
Lab Results  Component Value Date   HGBA1C 5.4 08/04/2022   disc imp of low glycemic diet and wt loss to prevent DM2

## 2022-10-14 ENCOUNTER — Ambulatory Visit
Admission: RE | Admit: 2022-10-14 | Discharge: 2022-10-14 | Disposition: A | Discharge status: Home / Self Care | Payer: Medicare Other | Source: Ambulatory Visit | Attending: Family Medicine | Admitting: Family Medicine

## 2022-10-14 DIAGNOSIS — E2839 Other primary ovarian failure: Secondary | ICD-10-CM

## 2022-10-14 DIAGNOSIS — Z1231 Encounter for screening mammogram for malignant neoplasm of breast: Secondary | ICD-10-CM

## 2022-12-18 ENCOUNTER — Ambulatory Visit (INDEPENDENT_AMBULATORY_CARE_PROVIDER_SITE_OTHER): Payer: Medicare Other

## 2022-12-18 DIAGNOSIS — Z23 Encounter for immunization: Secondary | ICD-10-CM

## 2023-08-02 ENCOUNTER — Telehealth: Payer: Self-pay | Admitting: Family Medicine

## 2023-08-02 DIAGNOSIS — R7309 Other abnormal glucose: Secondary | ICD-10-CM

## 2023-08-02 DIAGNOSIS — Z13220 Encounter for screening for lipoid disorders: Secondary | ICD-10-CM | POA: Insufficient documentation

## 2023-08-02 NOTE — Telephone Encounter (Signed)
-----   Message from Gerry Krone sent at 07/20/2023  9:30 AM EDT ----- Regarding: Lab orders for Wed, 6.4.25 Patient is scheduled for CPX labs, please order future labs, Thanks , [name]

## 2023-08-05 ENCOUNTER — Ambulatory Visit: Payer: Self-pay | Admitting: Family Medicine

## 2023-08-05 ENCOUNTER — Other Ambulatory Visit (INDEPENDENT_AMBULATORY_CARE_PROVIDER_SITE_OTHER): Payer: Medicare Other

## 2023-08-05 DIAGNOSIS — Z13220 Encounter for screening for lipoid disorders: Secondary | ICD-10-CM | POA: Diagnosis not present

## 2023-08-05 DIAGNOSIS — R7309 Other abnormal glucose: Secondary | ICD-10-CM | POA: Diagnosis not present

## 2023-08-05 LAB — COMPREHENSIVE METABOLIC PANEL WITH GFR
ALT: 16 U/L (ref 0–35)
AST: 19 U/L (ref 0–37)
Albumin: 4.1 g/dL (ref 3.5–5.2)
Alkaline Phosphatase: 55 U/L (ref 39–117)
BUN: 18 mg/dL (ref 6–23)
CO2: 30 meq/L (ref 19–32)
Calcium: 9.2 mg/dL (ref 8.4–10.5)
Chloride: 104 meq/L (ref 96–112)
Creatinine, Ser: 0.83 mg/dL (ref 0.40–1.20)
GFR: 72.64 mL/min (ref >60.00)
Glucose, Bld: 101 mg/dL — ABNORMAL HIGH (ref 70–99)
Potassium: 4.7 meq/L (ref 3.5–5.1)
Sodium: 139 meq/L (ref 135–145)
Total Bilirubin: 0.5 mg/dL (ref 0.2–1.2)
Total Protein: 6.5 g/dL (ref 6.0–8.3)

## 2023-08-05 LAB — LIPID PANEL
Cholesterol: 199 mg/dL (ref 0–200)
HDL: 84.7 mg/dL (ref >39.00)
LDL Cholesterol: 106 mg/dL — ABNORMAL HIGH (ref 0–99)
NonHDL: 114.17
Total CHOL/HDL Ratio: 2
Triglycerides: 43 mg/dL (ref 0.0–149.0)
VLDL: 8.6 mg/dL (ref 0.0–40.0)

## 2023-08-05 LAB — HEMOGLOBIN A1C: Hgb A1c MFr Bld: 5.6 % (ref 4.6–6.5)

## 2023-08-08 ENCOUNTER — Encounter: Payer: Self-pay | Admitting: Family Medicine

## 2023-08-11 ENCOUNTER — Ambulatory Visit (INDEPENDENT_AMBULATORY_CARE_PROVIDER_SITE_OTHER): Payer: Medicare Other

## 2023-08-11 VITALS — Ht 66.5 in | Wt 129.0 lb

## 2023-08-11 DIAGNOSIS — Z0000 Encounter for general adult medical examination without abnormal findings: Secondary | ICD-10-CM

## 2023-08-11 DIAGNOSIS — Z1231 Encounter for screening mammogram for malignant neoplasm of breast: Secondary | ICD-10-CM | POA: Diagnosis not present

## 2023-08-11 NOTE — Progress Notes (Signed)
 Subjective:   Donna Larson is a 68 y.o. who presents for a Medicare Wellness preventive visit.  As a reminder, Annual Wellness Visits don't include a physical exam, and some assessments may be limited, especially if this visit is performed virtually. We may recommend an in-person follow-up visit with your provider if needed.  Visit Complete: Virtual I connected with  [name] on 08/11/23 by a audio enabled telemedicine application and verified that I am speaking with the correct person using two identifiers.  Patient Location: Home  Provider Location: Office/Clinic  I discussed the limitations of evaluation and management by telemedicine. The patient expressed understanding and agreed to proceed.  Vital Signs: Because this visit was a virtual/telehealth visit, some criteria may be missing or patient reported. Any vitals not documented were not able to be obtained and vitals that have been documented are patient reported.  VideoDeclined- This patient declined Librarian, academic. Therefore the visit was completed with audio only.  Persons Participating in Visit: Patient.  AWV Questionnaire: Yes: Patient Medicare AWV questionnaire was completed by the patient on 08/05/23; I have confirmed that all information answered by patient is correct and no changes since this date.  Cardiac Risk Factors include: advanced age (>38men, >59 women)     Objective:     Today's Vitals   08/11/23 1226  Weight: 129 lb (58.5 kg)  Height: 5' 6.5" (1.689 m)   Body mass index is 20.51 kg/m.     08/11/2023   12:33 PM 11/27/2021   12:15 PM 11/13/2021   11:36 AM 08/08/2021    9:02 AM 12/24/2016    6:47 AM  Advanced Directives  Does Patient Have a Medical Advance Directive? Yes Yes Yes Yes Yes  Type of Estate agent of [place];Living will Healthcare Power of [place];Living will Healthcare Power of [place];Living will Healthcare Power of  [place];Living will Living will  Does patient want to make changes to medical advance directive?  No - Patient declined No - Patient declined Yes (ED - Information included in AVS)   Copy of Healthcare Power of Attorney in Chart? No - copy requested No - copy requested Yes - validated most recent copy scanned in chart (See row information) Yes - validated most recent copy scanned in chart (See row information)   Would patient like information on creating a medical advance directive?  Yes (MAU/Ambulatory/Procedural Areas - Information given)       Current Medications (verified) Outpatient Encounter Medications as of 08/11/2023  Medication Sig   Calcium Carbonate-Vitamin D (CALCIUM-VITAMIN D) 600-200 MG-UNIT CAPS Take 1 capsule by mouth 3 (three) times daily.   Estradiol 10 MCG TABS vaginal tablet Place 20 mcg vaginally 2 (two) times a week.   FLUoxetine (PROZAC) 20 MG tablet Take 20 mg by mouth daily.   Nutritional Supplements (ESTROVEN PO) Take 1 tablet by mouth daily.   No facility-administered encounter medications on file as of 08/11/2023.    Allergies (verified) Penicillins, Shellfish allergy, and Sulfonamide derivatives   History: Past Medical History:  Diagnosis Date   Allergy    seasonal   Anxiety 11/2017   Marked increase in work stress, chronic and current   Cataract    Cataract surgery 11/2021   Complication of anesthesia    Hyperlipidemia    OCD (obsessive compulsive disorder)    PONV (postoperative nausea and vomiting)    Past Surgical History:  Procedure Laterality Date   ABDOMINAL HYSTERECTOMY  2000   Partial, fibroids  BREAST BIOPSY Left 2006   Negative   BREAST BIOPSY Left 2015   Negative   BREAST SURGERY Left 2006   CATARACT EXTRACTION W/PHACO Right 11/13/2021   Procedure: CATARACT EXTRACTION PHACO AND INTRAOCULAR LENS PLACEMENT (IOC) RIGHT TORIC LENS 3.91 00:53.0;  Surgeon: Annell Kidney, MD;  Location: [facility];  Service: Ophthalmology;   Laterality: Right;   CATARACT EXTRACTION W/PHACO Left 11/27/2021   Procedure: CATARACT EXTRACTION PHACO AND INTRAOCULAR LENS PLACEMENT (IOC) LEFT TORIC LENS 4.23 00:55.4;  Surgeon: Annell Kidney, MD;  Location: [facility];  Service: Ophthalmology;  Laterality: Left;   COLONOSCOPY  12/2006   COLONOSCOPY WITH PROPOFOL  N/A 12/24/2016   Procedure: COLONOSCOPY WITH PROPOFOL ;  Surgeon: Marshall Skeeter, MD;  Location: [facility];  Service: Endoscopy;  Laterality: N/A;   COSMETIC SURGERY  2010, 2020   Liposuction, Blepharoplasty and ptosis repair   DOPPLER ECHOCARDIOGRAPHY  1997   Equivocal   EYE SURGERY  2023   Cataract removal   LIPOSUCTION  2010   OOPHORECTOMY     TUBAL LIGATION  1992   Family History  Problem Relation Age of Onset   Arthritis Mother        osteoarthritis   Depression Mother    Hypertension Mother    Osteoporosis Mother    Hyperlipidemia Father    Heart disease Father        Bicuspid aortic valve - replaced; sick sinus syndrome   Obesity Father    Heart disease Other        Heart valve repair   Cancer Other        1 had Breast CA ; 2 had colon CA   Breast cancer Other    Heart disease Paternal Grandfather        MI   Cancer Paternal Grandfather        Renal   Obesity Paternal Grandfather    Stroke Maternal Grandmother    Varicose Veins Maternal Grandmother    Heart disease Paternal Grandmother    Obesity Paternal Grandmother    Diabetes Paternal Uncle    Obesity Paternal Uncle    COPD Paternal Uncle    Social History   Socioeconomic History   Marital status: Married    Spouse name: Not on file   Number of children: 0   Years of education: Not on file   Highest education level: Professional school degree (e.g., MD, DDS, DVM, JD)  Occupational History   Occupation: Social work    Associate Professor: [employer]  Tobacco Use   Smoking status: Never   Smokeless tobacco: Never  Vaping Use   Vaping status: Never Used  Substance and Sexual  Activity   Alcohol use: Yes    Alcohol/week: 11.0 standard drinks of alcohol    Types: 6 Glasses of wine, 5 Cans of beer per week    Comment: Average 1 1/2 drinks/day   Drug use: No   Sexual activity: Yes    Birth control/protection: Post-menopausal, Surgical  Other Topics Concern   Not on file  Social History Narrative   Regular exercise:  Walking and calesthenics   Occupation: Sports administrator for [employer]    Social Drivers of Health   Financial Resource Strain: Low Risk  (08/05/2023)   Overall Financial Resource Strain (CARDIA)    Difficulty of Paying Living Expenses: Not hard at all  Food Insecurity: No Food Insecurity (08/05/2023)   Hunger Vital Sign    Worried About Running Out of Food in the Last Year: Never  true    Ran Out of Food in the Last Year: Never true  Transportation Needs: No Transportation Needs (08/05/2023)   PRAPARE - Administrator, Civil Service (Medical): No    Lack of Transportation (Non-Medical): No  Physical Activity: Sufficiently Active (08/05/2023)   Exercise Vital Sign    Days of Exercise per Week: 6 days    Minutes of Exercise per Session: 50 min  Stress: No Stress Concern Present (08/05/2023)   [employer] of Occupational Health - Occupational Stress Questionnaire    Feeling of Stress : Only a little  Social Connections: Moderately Integrated (08/05/2023)   Social Connection and Isolation Panel [NHANES]    Frequency of Communication with Friends and Family: Once a week    Frequency of Social Gatherings with Friends and Family: Once a week    Attends Religious Services: More than 4 times per year    Active Member of [employer] or Organizations: Yes    Attends Engineer, structural: More than 4 times per year    Marital Status: Married    Tobacco Counseling Counseling given: Not Answered    Clinical Intake:  Pre-visit preparation completed: No  Pain : No/denies pain     BMI - recorded: 20.51 Nutritional Status: BMI of 19-24   Normal Nutritional Risks: None Diabetes: No  Lab Results  Component Value Date   HGBA1C 5.6 08/05/2023   HGBA1C 5.4 08/04/2022   HGBA1C 5.6 08/01/2021     How often do you need to have someone help you when you read instructions, pamphlets, or other written materials from your doctor or pharmacy?: 1 - Never  Interpreter Needed?: No  Comments: lives with husband Information entered by :: B.Prathik Aman,LPN   Activities of Daily Living     08/05/2023    8:48 AM  In your present state of health, do you have any difficulty performing the following activities:  Vision? 0  Difficulty concentrating or making decisions? 0  Walking or climbing stairs? 0  Dressing or bathing? 0  Doing errands, shopping? 0  Preparing Food and eating ? N  Using the Toilet? N  In the past six months, have you accidently leaked urine? Y  Do you have problems with loss of bowel control? N  Managing your Medications? N  Managing your Finances? N  Housekeeping or managing your Housekeeping? N    Patient Care Team: Tower, Manley Seeds, MD as PCP - General Byrnett, Magali Schmitz, MD as Consulting Physician (General Surgery) Annell Kidney, MD as Referring Physician (Ophthalmology)  I have updated your Care Teams any recent Medical Services you may have received from other providers in the past year.     Assessment:    This is a routine wellness examination for Zoella.  Hearing/Vision screen Hearing Screening - Comments:: Pt says her hearing is good Vision Screening - Comments:: Pt says her vision is good only readers Dr Ignatius Makos   Goals Addressed             This Visit's Progress    Patient Stated       I am having my bunions repaired this summer. I would like to remain my weight under control and healthy lifestyle       Depression Screen     08/11/2023   12:32 PM 08/11/2022    8:55 AM 08/08/2021    9:04 AM 08/07/2020    8:23 AM 07/29/2019    9:11 AM 07/27/2018    8:48 AM 07/13/2017  8:30  AM  PHQ 2/9 Scores  PHQ - 2 Score 0 0 0 0 0 0 0  PHQ- 9 Score  1   1      Fall Risk     08/05/2023    8:48 AM 08/11/2022    8:55 AM 08/08/2021    9:03 AM 08/07/2020    8:23 AM  Fall Risk   Falls in the past year? 0 0 1 0  Number falls in past yr: 0 0 0   Injury with Fall? 0 0    Risk for fall due to : No Fall Risks No Fall Risks    Follow up Education provided;Falls prevention discussed Falls evaluation completed Falls evaluation completed Falls evaluation completed    MEDICARE RISK AT HOME:  Medicare Risk at Home Any stairs in or around the home?: (Patient-Rptd) Yes If so, are there any without handrails?: (Patient-Rptd) Yes Home free of loose throw rugs in walkways, pet beds, electrical cords, etc?: (Patient-Rptd) Yes Adequate lighting in your home to reduce risk of falls?: (Patient-Rptd) Yes Life alert?: (Patient-Rptd) No Use of a cane, walker or w/c?: (Patient-Rptd) No Grab bars in the bathroom?: (Patient-Rptd) No Shower chair or bench in shower?: (Patient-Rptd) No Elevated toilet seat or a handicapped toilet?: (Patient-Rptd) No  TIMED UP AND GO:  Was the test performed?  No  Cognitive Function: 6CIT completed        08/11/2023   12:40 PM 08/08/2021    9:06 AM  6CIT Screen  What Year? 0 points 0 points  What month? 0 points 0 points  What time? 0 points 0 points  Count back from 20 0 points 0 points  Months in reverse 0 points 0 points  Repeat phrase 0 points 0 points  Total Score 0 points 0 points    Immunizations Immunization History  Administered Date(s) Administered   Fluad Quad(high Dose 65+) 12/21/2020, 11/29/2021   Fluad Trivalent(High Dose 65+) 12/18/2022   Hepatitis A 04/23/2001, 11/23/2001   IPV 11/29/2002   Influenza Split 12/16/2010   Influenza Whole 02/16/2004   Influenza,inj,Quad PF,6+ Mos 12/07/2013, 12/14/2014, 12/11/2016, 11/26/2019   Influenza-Unspecified 11/22/2012, 12/12/2015, 11/18/2017, 11/21/2018   PFIZER(Purple Top)SARS-COV-2  Vaccination 03/22/2019, 04/11/2019, 01/02/2020   PNEUMOCOCCAL CONJUGATE-20 08/07/2020   Td 11/02/2002   Tdap 02/01/2012, 08/17/2021   Typhoid Inactivated 04/23/2001   Zoster Recombinant(Shingrix) 08/10/2019, 10/25/2019    Screening Tests Health Maintenance  Topic Date Due   COVID-19 Vaccine (4 - 2024-25 season) 11/02/2022   INFLUENZA VACCINE  10/02/2023   MAMMOGRAM  10/14/2023   Medicare Annual Wellness (AWV)  08/10/2024   Colonoscopy  12/25/2026   DTaP/Tdap/Td (4 - Td or Tdap) 08/18/2031   Pneumonia Vaccine 82+ Years old  Completed   DEXA SCAN  Completed   Hepatitis C Screening  Completed   Zoster Vaccines- Shingrix  Completed   HPV VACCINES  Aged Out   Meningococcal B Vaccine  Aged Out    Health Maintenance  Health Maintenance Due  Topic Date Due   COVID-19 Vaccine (4 - 2024-25 season) 11/02/2022   Health Maintenance Items Addressed: DEXA ordered  Additional Screening:  Vision Screening: Recommended annual ophthalmology exams for early detection of glaucoma and other disorders of the eye. Would you like a referral to an eye doctor? No    Dental Screening: Recommended annual dental exams for proper oral hygiene  Community Resource Referral / Chronic Care Management: CRR required this visit?  No   CCM required this visit?  No  Plan:    I have personally reviewed and noted the following in the patient's chart:   Medical and social history Use of alcohol, tobacco or illicit drugs  Current medications and supplements including opioid prescriptions. Patient is not currently taking opioid prescriptions. Functional ability and status Nutritional status Physical activity Advanced directives List of other physicians Hospitalizations, surgeries, and ER visits in previous 12 months Vitals Screenings to include cognitive, depression, and falls Referrals and appointments  In addition, I have reviewed and discussed with patient certain preventive protocols,  quality metrics, and best practice recommendations. A written personalized care plan for preventive services as well as general preventive health recommendations were provided to patient.   Nerissa Bannister, LPN   [id number]/[id number]   After Visit Summary: (MyChart) Due to this being a telephonic visit, the after visit summary with patients personalized plan was offered to patient via MyChart   Notes: Pt relays experiencing problems with rt foot lagging or tripping and difficulty swallowing. She will mention at PCP visit tomorrow.

## 2023-08-11 NOTE — Patient Instructions (Signed)
 Ms. Italiano , Thank you for taking time out of your busy schedule to complete your Annual Wellness Visit with me. I enjoyed our conversation and look forward to speaking with you again next year. I, as well as your care team,  appreciate your ongoing commitment to your health goals. Please review the following plan we discussed and let me know if I can assist you in the future. Your Game plan/ To Do List    Referrals: If you haven't heard from the office you've been referred to, please reach out to them at the phone provided.  You have an order for:  []   2D Mammogram  [x]   3D Mammogram  []   Bone Density     Please call for appointment:  [facility]  [address] [place] [address] [phone]    Make sure to wear two-piece clothing.  No lotions, powders, or deodorants the day of the appointment. Make sure to bring picture ID and insurance card.  Bring list of medications you are currently taking including any supplements.   Follow up Visits: Next Medicare AWV with our clinical staff: 08/11/24 @ 1pm   Have you seen your provider in the last 6 months (3 months if uncontrolled diabetes)? No Next Office Visit with your provider: 08/12/23  Clinician Recommendations:  Aim for 30 minutes of exercise or brisk walking, 6-8 glasses of water, and 5 servings of fruits and vegetables each day.       This is a list of the screening recommended for you and due dates:  Health Maintenance  Topic Date Due   COVID-19 Vaccine (4 - 2024-25 season) 11/02/2022   Flu Shot  10/02/2023   Mammogram  10/14/2023   Medicare Annual Wellness Visit  08/10/2024   Colon Cancer Screening  12/25/2026   DTaP/Tdap/Td vaccine (4 - Td or Tdap) 08/18/2031   Pneumonia Vaccine  Completed   DEXA scan (bone density measurement)  Completed   Hepatitis C Screening  Completed   Zoster (Shingles) Vaccine  Completed   HPV Vaccine  Aged Out   Meningitis B Vaccine  Aged  Out    Advanced directives: (Copy Requested) Please bring a copy of your health care power of attorney and living will to the office to be added to your chart at your convenience. You can mail to [facility] [address] [place], [place] [address] or email to [email or web address] Advance Care Planning is important because it:  [x]  Makes sure you receive the medical care that is consistent with your values, goals, and preferences  [x]  It provides guidance to your family and loved ones and reduces their decisional burden about whether or not they are making the right decisions based on your wishes.  Follow the link provided in your after visit summary or read over the paperwork we have mailed to you to help you started getting your Advance Directives in place. If you need assistance in completing these, please reach out to us  so that we can help you!

## 2023-08-11 NOTE — Addendum Note (Signed)
 Addended by: Nerissa Bannister on: 08/11/2023 02:12 PM   Modules accepted: Orders

## 2023-08-12 ENCOUNTER — Ambulatory Visit (INDEPENDENT_AMBULATORY_CARE_PROVIDER_SITE_OTHER): Payer: Medicare Other | Admitting: Family Medicine

## 2023-08-12 ENCOUNTER — Encounter: Payer: Self-pay | Admitting: Family Medicine

## 2023-08-12 VITALS — BP 96/62 | HR 72 | Temp 98.2°F | Ht 66.0 in | Wt 135.4 lb

## 2023-08-12 DIAGNOSIS — R7309 Other abnormal glucose: Secondary | ICD-10-CM

## 2023-08-12 DIAGNOSIS — M8580 Other specified disorders of bone density and structure, unspecified site: Secondary | ICD-10-CM

## 2023-08-12 DIAGNOSIS — Z13220 Encounter for screening for lipoid disorders: Secondary | ICD-10-CM

## 2023-08-12 NOTE — Assessment & Plan Note (Signed)
 Dexa 10/2022 No falls or fracture Discussed fall prevention, supplements and exercise for bone density

## 2023-08-12 NOTE — Patient Instructions (Addendum)
 Take care of yourself  Keep exercising and eating healthy diet  Labs are good/stable

## 2023-08-12 NOTE — Assessment & Plan Note (Signed)
 Good lipid profile  Disc goals for lipids and reasons to control them Rev last labs with pt Rev low sat fat diet in detail LDL 106 HDL high at 84.7

## 2023-08-12 NOTE — Assessment & Plan Note (Signed)
 Lab Results  Component Value Date   HGBA1C 5.6 08/05/2023   HGBA1C 5.4 08/04/2022   HGBA1C 5.6 08/01/2021   Reassuring  disc imp of low glycemic diet and wt loss to prevent DM2

## 2023-08-12 NOTE — Progress Notes (Signed)
 Subjective:    Patient ID: Donna Larson, female    DOB: [birth year], 68 y.o.   MRN: [id number]  HPI  Here for annual follow up of chronic health problems   Wt Readings from Last 3 Encounters:  08/12/23 135 lb 6 oz (61.4 kg)  08/11/23 129 lb (58.5 kg)  08/11/22 129 lb (58.5 kg)   21.85 kg/m  Vitals:   08/12/23 0858  BP: 96/62  Pulse: 72  Temp: 98.2 F (36.8 C)  SpO2: 97%    Immunization History  Administered Date(s) Administered   Fluad Quad(high Dose 65+) 12/21/2020, 11/29/2021   Fluad Trivalent(High Dose 65+) 12/18/2022   Hepatitis A 04/23/2001, 11/23/2001   IPV 11/29/2002   Influenza Split 12/16/2010   Influenza Whole 02/16/2004   Influenza,inj,Quad PF,6+ Mos 12/07/2013, 12/14/2014, 12/11/2016, 11/26/2019   Influenza-Unspecified 11/22/2012, 12/12/2015, 11/18/2017, 11/21/2018   PFIZER(Purple Top)SARS-COV-2 Vaccination 03/22/2019, 04/11/2019, 01/02/2020   PNEUMOCOCCAL CONJUGATE-20 08/07/2020   Td 11/02/2002   Tdap 02/01/2012, 08/17/2021   Typhoid Inactivated 04/23/2001   Zoster Recombinant(Shingrix) 08/10/2019, 10/25/2019    There are no preventive care reminders to display for this patient.  Working a lot  Taking trip to [place] end of the month  Retirement in November !    Mammogram 10/2022  Self breast exam- no lumps   Gyn health Was there in march  No problems  Uses estrogen vaginal tablet-very helpful    Colon cancer screening  colonoscopy 12/2016   Bone health  Dexa  10/2022  osteopenia  Falls- none  Fractures-none  Supplements  Ca and D  Exercise  Walking 2.5 mi per day  Some floor exercises , also weights  Core work-sit ups   Having her bunions fixed soon   Utd derm care  Uses sunscreen    Mood    08/11/2023   12:32 PM 08/11/2022    8:55 AM 08/08/2021    9:04 AM 08/07/2020    8:23 AM 07/29/2019    9:11 AM  Depression screen PHQ 2/9  Decreased Interest 0 0 0 0 0  Down, Depressed, Hopeless 0 0 0 0 0  PHQ - 2 Score 0 0 0 0 0   Altered sleeping  1   1  Tired, decreased energy  0   0  Change in appetite  0   0  Feeling bad or failure about yourself   0   0  Trouble concentrating  0   0  Moving slowly or fidgety/restless  0   0  Suicidal thoughts  0   0  PHQ-9 Score  1   1  Difficult doing work/chores  Not difficult at all   Not difficult at all   OCD Fluoxetine 20 mg daily  Doing ok overall     Glucose Lab Results  Component Value Date   HGBA1C 5.6 08/05/2023   HGBA1C 5.4 08/04/2022   HGBA1C 5.6 08/01/2021    Lipid screen Lab Results  Component Value Date   CHOL 199 08/05/2023   CHOL 178 08/04/2022   CHOL 206 (H) 08/01/2021   Lab Results  Component Value Date   HDL 84.70 08/05/2023   HDL 75.70 08/04/2022   HDL 88.70 08/01/2021   Lab Results  Component Value Date   LDLCALC 106 (H) 08/05/2023   LDLCALC 92 08/04/2022   LDLCALC 108 (H) 08/01/2021   Lab Results  Component Value Date   TRIG 43.0 08/05/2023   TRIG 52.0 08/04/2022   TRIG 45.0 08/01/2021   Lab Results  Component Value Date   CHOLHDL 2 08/05/2023   CHOLHDL 2 08/04/2022   CHOLHDL 2 08/01/2021   Lab Results  Component Value Date   LDLDIRECT 109.0 10/30/2008   Had eaten some butter and bacon recently     Lab Results  Component Value Date   NA 139 08/05/2023   K 4.7 08/05/2023   CO2 30 08/05/2023   GLUCOSE 101 (H) 08/05/2023   BUN 18 08/05/2023   CREATININE 0.83 08/05/2023   CALCIUM 9.2 08/05/2023   GFR 72.64 08/05/2023   GFRNONAA 79.67 10/30/2008   Lab Results  Component Value Date   ALT 16 08/05/2023   AST 19 08/05/2023   ALKPHOS 55 08/05/2023   BILITOT 0.5 08/05/2023      Patient Active Problem List   Diagnosis Date Noted   Lipid screening 08/02/2023   Osteopenia 10/07/2020   Welcome to Medicare preventive visit 08/07/2020   Estrogen deficiency 08/07/2020   Increased glucose level 07/27/2018   Encounter for screening mammogram for breast cancer 02/17/2011   Routine general medical  examination at a health care facility 12/05/2010   Obsessive-compulsive disorder 10/09/2006   Allergic rhinitis 10/09/2006   DERMATITIS, CONTACT, NOS 10/09/2006   Past Medical History:  Diagnosis Date   Allergy    seasonal   Anxiety 11/2017   Marked increase in work stress, chronic and current   Cataract    Cataract surgery 11/2021   Complication of anesthesia    Hyperlipidemia    OCD (obsessive compulsive disorder)    PONV (postoperative nausea and vomiting)    Past Surgical History:  Procedure Laterality Date   ABDOMINAL HYSTERECTOMY  2000   Partial, fibroids   BREAST BIOPSY Left 2006   Negative   BREAST BIOPSY Left 2015   Negative   BREAST SURGERY Left 2006   CATARACT EXTRACTION W/PHACO Right 11/13/2021   Procedure: CATARACT EXTRACTION PHACO AND INTRAOCULAR LENS PLACEMENT (IOC) RIGHT TORIC LENS 3.91 00:53.0;  Surgeon: Annell Kidney, MD;  Location: [facility];  Service: Ophthalmology;  Laterality: Right;   CATARACT EXTRACTION W/PHACO Left 11/27/2021   Procedure: CATARACT EXTRACTION PHACO AND INTRAOCULAR LENS PLACEMENT (IOC) LEFT TORIC LENS 4.23 00:55.4;  Surgeon: Annell Kidney, MD;  Location: [facility];  Service: Ophthalmology;  Laterality: Left;   COLONOSCOPY  12/2006   COLONOSCOPY WITH PROPOFOL  N/A 12/24/2016   Procedure: COLONOSCOPY WITH PROPOFOL ;  Surgeon: Marshall Skeeter, MD;  Location: [facility];  Service: Endoscopy;  Laterality: N/A;   COSMETIC SURGERY  2010, 2020   Liposuction, Blepharoplasty and ptosis repair   DOPPLER ECHOCARDIOGRAPHY  1997   Equivocal   EYE SURGERY  2023   Cataract removal   LIPOSUCTION  2010   OOPHORECTOMY     TUBAL LIGATION  1992   Social History   Tobacco Use   Smoking status: Never   Smokeless tobacco: Never  Vaping Use   Vaping status: Never Used  Substance Use Topics   Alcohol use: Yes    Alcohol/week: 11.0 standard drinks of alcohol    Types: 6 Glasses of wine, 5 Cans of beer per week     Comment: Average 1 1/2 drinks/day   Drug use: No   Family History  Problem Relation Age of Onset   Arthritis Mother        osteoarthritis   Depression Mother    Hypertension Mother    Osteoporosis Mother    Hyperlipidemia Father    Heart disease Father        Bicuspid  aortic valve - replaced; sick sinus syndrome   Obesity Father    Heart disease Other        Heart valve repair   Cancer Other        1 had Breast CA ; 2 had colon CA   Breast cancer Other    Heart disease Paternal Grandfather        MI   Cancer Paternal Grandfather        Renal   Obesity Paternal Grandfather    Stroke Maternal Grandmother    Varicose Veins Maternal Grandmother    Heart disease Paternal Grandmother    Obesity Paternal Grandmother    Diabetes Paternal Uncle    Obesity Paternal Uncle    COPD Paternal Uncle    Allergies  Allergen Reactions   Penicillins     REACTION: reaction not known  Family history so allergist told her to never take it   Shellfish Allergy Nausea And Vomiting   Sulfonamide Derivatives     REACTION: reaction not known  Family history of allergy and allergist told her not to take it   Current Outpatient Medications on File Prior to Visit  Medication Sig Dispense Refill   Calcium Carbonate-Vitamin D (CALCIUM-VITAMIN D) 600-200 MG-UNIT CAPS Take 1 capsule by mouth 3 (three) times daily.     Estradiol 10 MCG TABS vaginal tablet Place 20 mcg vaginally 2 (two) times a week.     FLUoxetine (PROZAC) 20 MG tablet Take 20 mg by mouth daily.     Nutritional Supplements (ESTROVEN PO) Take 1 tablet by mouth daily.     No current facility-administered medications on file prior to visit.    Review of Systems  Constitutional:  Negative for activity change, appetite change, fatigue, fever and unexpected weight change.  HENT:  Negative for congestion, ear pain, rhinorrhea, sinus pressure and sore throat.   Eyes:  Negative for pain, redness and visual disturbance.  Respiratory:   Negative for cough, shortness of breath and wheezing.   Cardiovascular:  Negative for chest pain and palpitations.  Gastrointestinal:  Negative for abdominal pain, blood in stool, constipation and diarrhea.  Endocrine: Negative for polydipsia and polyuria.  Genitourinary:  Negative for dysuria, frequency and urgency.  Musculoskeletal:  Negative for arthralgias, back pain and myalgias.  Skin:  Negative for pallor and rash.  Allergic/Immunologic: Negative for environmental allergies.  Neurological:  Negative for dizziness, syncope and headaches.  Hematological:  Negative for adenopathy. Does not bruise/bleed easily.  Psychiatric/Behavioral:  Negative for decreased concentration and dysphoric mood. The patient is not nervous/anxious.        Stress will go down with retirement        Objective:   Physical Exam Constitutional:      General: She is not in acute distress.    Appearance: Normal appearance. She is well-developed and normal weight. She is not ill-appearing or diaphoretic.  HENT:     Head: Normocephalic and atraumatic.     Right Ear: Tympanic membrane, ear canal and external ear normal.     Left Ear: Tympanic membrane, ear canal and external ear normal.     Nose: Nose normal. No congestion.     Mouth/Throat:     Mouth: Mucous membranes are moist.     Pharynx: Oropharynx is clear. No posterior oropharyngeal erythema.  Eyes:     General: No scleral icterus.    Extraocular Movements: Extraocular movements intact.     Conjunctiva/sclera: Conjunctivae normal.     Pupils: Pupils  are equal, round, and reactive to light.  Neck:     Thyroid : No thyromegaly.     Vascular: No carotid bruit or JVD.  Cardiovascular:     Rate and Rhythm: Normal rate and regular rhythm.     Pulses: Normal pulses.     Heart sounds: Normal heart sounds.     No gallop.  Pulmonary:     Effort: Pulmonary effort is normal. No respiratory distress.     Breath sounds: Normal breath sounds. No wheezing.      Comments: Good air exch Chest:     Chest wall: No tenderness.  Abdominal:     General: Bowel sounds are normal. There is no distension or abdominal bruit.     Palpations: Abdomen is soft. There is no mass.     Tenderness: There is no abdominal tenderness.     Hernia: No hernia is present.  Genitourinary:    Comments: Breast and pelvic exam are done by gyn provider     Musculoskeletal:        General: No tenderness. Normal range of motion.     Cervical back: Normal range of motion and neck supple. No rigidity. No muscular tenderness.     Right lower leg: No edema.     Left lower leg: No edema.     Comments: No kyphosis   Lymphadenopathy:     Cervical: No cervical adenopathy.  Skin:    General: Skin is warm and dry.     Coloration: Skin is not pale.     Findings: No erythema or rash.     Comments: Solar lentigines diffusely   Neurological:     Mental Status: She is alert. Mental status is at baseline.     Cranial Nerves: No cranial nerve deficit.     Motor: No abnormal muscle tone.     Coordination: Coordination normal.     Gait: Gait normal.     Deep Tendon Reflexes: Reflexes are normal and symmetric. Reflexes normal.  Psychiatric:        Mood and Affect: Mood normal.        Cognition and Memory: Cognition and memory normal.           Assessment & Plan:   Problem List Items Addressed This Visit       Musculoskeletal and Integument   Osteopenia   Dexa 10/2022 No falls or fracture Discussed fall prevention, supplements and exercise for bone density          Other   Lipid screening   Good lipid profile  Disc goals for lipids and reasons to control them Rev last labs with pt Rev low sat fat diet in detail LDL 106 HDL high at 84.7        Increased glucose level - Primary   Lab Results  Component Value Date   HGBA1C 5.6 08/05/2023   HGBA1C 5.4 08/04/2022   HGBA1C 5.6 08/01/2021   Reassuring  disc imp of low glycemic diet and wt loss to prevent DM2

## 2023-10-15 ENCOUNTER — Ambulatory Visit
Admission: RE | Admit: 2023-10-15 | Discharge: 2023-10-15 | Disposition: A | Discharge status: Home / Self Care | Source: Ambulatory Visit | Attending: Family Medicine | Admitting: Family Medicine

## 2023-10-15 DIAGNOSIS — Z1231 Encounter for screening mammogram for malignant neoplasm of breast: Secondary | ICD-10-CM | POA: Insufficient documentation

## 2023-10-16 ENCOUNTER — Ambulatory Visit: Payer: Self-pay | Admitting: Family Medicine

## 2023-11-29 ENCOUNTER — Encounter: Payer: Self-pay | Admitting: Family Medicine

## 2023-11-30 NOTE — Telephone Encounter (Signed)
 Lvm to schedule flu shot nurse visit

## 2023-11-30 NOTE — Telephone Encounter (Signed)
 Please schedule

## 2023-12-10 ENCOUNTER — Ambulatory Visit (INDEPENDENT_AMBULATORY_CARE_PROVIDER_SITE_OTHER)

## 2023-12-10 DIAGNOSIS — Z23 Encounter for immunization: Secondary | ICD-10-CM

## 2024-08-05 ENCOUNTER — Other Ambulatory Visit

## 2024-08-11 ENCOUNTER — Ambulatory Visit

## 2024-08-12 ENCOUNTER — Ambulatory Visit

## 2024-08-12 ENCOUNTER — Encounter: Admitting: Family Medicine
